# Patient Record
Sex: Male | Born: 1960 | Race: White | Hispanic: No | Smoking: Current every day smoker
Health system: Southern US, Academic
[De-identification: ages and names within clinical notes are randomized; demographics above are authoritative.]

## PROBLEM LIST (undated history)

## (undated) DIAGNOSIS — I10 Essential (primary) hypertension: Secondary | ICD-10-CM

## (undated) DIAGNOSIS — N289 Disorder of kidney and ureter, unspecified: Secondary | ICD-10-CM

## (undated) DIAGNOSIS — E119 Type 2 diabetes mellitus without complications: Secondary | ICD-10-CM

## (undated) DIAGNOSIS — K529 Noninfective gastroenteritis and colitis, unspecified: Secondary | ICD-10-CM

## (undated) DIAGNOSIS — E785 Hyperlipidemia, unspecified: Secondary | ICD-10-CM

## (undated) DIAGNOSIS — N400 Enlarged prostate without lower urinary tract symptoms: Secondary | ICD-10-CM

## (undated) DIAGNOSIS — Z0000 Encounter for general adult medical examination without abnormal findings: Secondary | ICD-10-CM

## (undated) DIAGNOSIS — Z720 Tobacco use: Secondary | ICD-10-CM

## (undated) DIAGNOSIS — M722 Plantar fascial fibromatosis: Secondary | ICD-10-CM

## (undated) HISTORY — DX: Hyperlipidemia, unspecified: E78.5

## (undated) HISTORY — DX: Type 2 diabetes mellitus without complications: E11.9

## (undated) HISTORY — DX: Type 2 diabetes mellitus without complications (CMS HCC): E11.9

## (undated) HISTORY — PX: OTHER SURGICAL HISTORY: SHX169

## (undated) HISTORY — DX: Tobacco use: Z72.0

## (undated) HISTORY — DX: Benign prostatic hyperplasia without lower urinary tract symptoms: N40.0

## (undated) HISTORY — DX: Encounter for general adult medical examination without abnormal findings: Z00.00

## (undated) HISTORY — DX: Plantar fascial fibromatosis: M72.2

## (undated) HISTORY — PX: COLONOSCOPY: SHX174

## (undated) HISTORY — PX: TOOTH EXTRACTION: SUR596

---

## 2010-02-13 ENCOUNTER — Encounter (INDEPENDENT_AMBULATORY_CARE_PROVIDER_SITE_OTHER): Payer: Self-pay | Admitting: Anesthesiology

## 2010-02-13 ENCOUNTER — Ambulatory Visit (INDEPENDENT_AMBULATORY_CARE_PROVIDER_SITE_OTHER): Payer: 59 | Admitting: Anesthesiology
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
## 2010-02-13 NOTE — Patient Instructions (Signed)
[facility]  [address]  [address]  [place], [place] [address]  [phone]    Outpatient Procedure Guidelines        1. You MUST bring a driver with you on the day of your injection or your procedure will be cancelled and you MUST arrive 30 minutes prior to your scheduled appointment time.    2. You must be free of any infection in order to have an injection of any type.  If you become ill, please call the clinic at [phone], for advice regarding the rescheduling of your injection.  Your injection will be cancelled if you are presently on any antibiotic treatment for infection.      3. You may take any scheduled medications, including pain medications, blood pressure medications and heart medications, unless otherwise advised.    4. Please notify the pain clinic if you are on any Blood Thinners such as: Plavix, Ticlid, Coumadin, Platal, Lovenox, Aggrenox or any other acticoagulant. These medications require special instructions.      5. DIABETIC PATIENTS: Please be sure to check your glucose level in the morning of your injection.  Also, you will need to check your glucose levels more frequently for the 1st two or three days following your injection. (Some patients may notice an increase in their glucose levels after their procedure).    6. No solid food at least 6 hours prior to your scheduled procedure time.  You may have small amounts of clear liquids up to 2 hours prior to the procedure.

## 2010-02-19 ENCOUNTER — Ambulatory Visit (INDEPENDENT_AMBULATORY_CARE_PROVIDER_SITE_OTHER): Payer: No Typology Code available for payment source | Admitting: Anesthesiology

## 2010-02-19 NOTE — Procedures (Signed)
[facility]  [facility]  [address], [address]  [place], [place] [address]  [phone]    PAIN CLINIC    PATIENT NAME: Raymond Day  CHART ZDGUYQ:[id number]  DATE OF BIRTH: 1960/11/20  DATE OF SERVICE:02/19/2010    SITE OF SERVICE  [facility].    PREOPERATIVE DIAGNOSIS:CERVICAL NERVE ROOT COMPRESSION [723.4AW]INJURY, NERVE, SYMPATHETIC, CERVICAL [954.0D]   HERNIATED CERVICAL DISC [638.0AS]POSTOPERATIVE DIAGNOSIS: Same      NAME OF PROCEDURE: Cervical epidural.  .     SURGEON:  Rodman Comp, MD (staff).      ANESTHESIA: Local        ESTIMATED BLOOD LOSS:None      COMPLICATIONS:  None      DESCRIPTION OF PROCEDURE:  Informed consent was obtained.  The patient was taken to the operating room and placed in a prone position.  The neck was prepped and draped.  The C7- T1 interspace was identified.  Appropriate cervical diameter measurements were performed.  Local infiltration with 1% Xylocaine was performed.  Loss of resistance with saline identified the epidural space.  Fluoroscopic guidance was used.  Position was verified with Omnipaque, which showed a cervical epidurogram with no washout.  A total of 4 mL of volume was injected, including 4cc Dexamethasone.  The patient tolerated the procedure well and will follow up in clinic.    Rodman Comp, MD 02/19/2010, 11:19 AM

## 2010-02-19 NOTE — Patient Instructions (Signed)
[facility]  Pain Management Services                                           SPECIAL PROCEDURES                                     DISCHARGE FORM                                          [phone]      Please follow the instructions listed below for your procedures.  If you have questions concerning your procedure, you may call and leave a message.  Messages will be returned by the end of the next business day.  If you have an emergency, proceed to your local [facility] Department.      PROCEDURE: ESI C7 T1    1.  Do not drive a car or operate machinery until tomorrow.  2.  Rest today and return to normal activities tomorrow.  3.  If you are on restricted actiivities by your physician, please continue to follow these.  If      you are not sure, contact your physician.  4.  It is possible to experience mild numbness of the lower back and legs.  This is                  temporary.  5   If you have soreness at the injection site, the application of heat or ice may be helpful.      Mild analgesics may also be used.  6.  In case of sever headache; lie flat to decrease it.  Increase all fluids especially those         with caffeine.  Mild analgesics are also appropriate.  If headache is not relieved by             these measures, contact the Pain Clinic.  7.  Steroid injections may cause temporary increase of blood sugar levels.    These instructions have been reviewed with the patient and appropriate questions have answers.  Seher Schlagel

## 2010-02-19 NOTE — Progress Notes (Signed)
Patient Active Problem List   Diagnoses Code   . Neck pain 723.1B   . Shoulder pain, bilateral 719.41AQ       1. Cervical nerve root compression (723.4AW)  PAIN CL FL EPIDURAL STEROID INJ CERVICAL, EPIDURAL CERV THORACIC (AMB ONLY)   2. 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 Herniated cervical disc (722.0AS)  PAIN CL FL EPIDURAL STEROID INJ CERVICAL, EPIDURAL CERV THORACIC (AMB ONLY)       Chief Complaint   Patient presents with   . Neck Pain     all over   . Shoulder Pain     left and right   . Head Pain     left side

## 2010-03-09 ENCOUNTER — Encounter (INDEPENDENT_AMBULATORY_CARE_PROVIDER_SITE_OTHER): Payer: Self-pay | Admitting: Anesthesiology

## 2010-03-09 ENCOUNTER — Ambulatory Visit (INDEPENDENT_AMBULATORY_CARE_PROVIDER_SITE_OTHER): Payer: No Typology Code available for payment source | Admitting: Anesthesiology

## 2010-03-09 MED ORDER — TRAMADOL 50 MG TABLET
50.0000 mg | ORAL_TABLET | Freq: Four times a day (QID) | ORAL | Status: DC | PRN
Start: 2010-03-09 — End: 2021-01-22

## 2010-03-09 NOTE — Progress Notes (Signed)
Date:03/09/2010    Chief Complaint:Chief Complaint   Patient presents with   . Neck Pain         Last Procedure:CESI no help      Medication:Current outpatient prescriptions   Medication Sig   . paroxetine (PAXIL) 10 mg Tab take 10 mg by mouth QAM.   . alprazolam (XANAX) 0.5 mg Tab take 1 mg by mouth Every night as needed.   . mesalamine (ASACOL) 400 mg TbEC take 400 mg by mouth Three times a day.   . Ibuprofen (MOTRIN) 800 mg Tab take 800 mg by mouth Twice daily.   Marland Kitchen gemfibrozil (LOPID) 600 mg Tab take 600 mg by mouth Twice a day before meals.   Marland Kitchen loperamide (IMODIUM) 2 mg Cap take 2 mg by mouth Twice daily.   . hydrocodone-acetaminophen (LORTAB) 7.5-500 mg Tab take 1 Tab by mouth Every 4 hours as needed.   . tramadol (ULTRAM) 50 mg Tab take 50 mg by mouth Every 6 hours as needed.                     PFMSHx: I have reviewed and updated as appropriate the past medical, family and social history.        ROS: Reviewed      Exam:    Constitutional: Vitals and Weight noted    Eyes: Conjunctiva and lids: normal    Head: NC/AT    Neck: Normal external inspection    Respiratory: normal effort    Cardiovascular: no JVD or peripheral cardiac edema    Abdomen: non tender    Neurological: DTR'S symmetrical                        Sensory intact                        Motor intact                          Psychiatric: Judgement normal                      Insight good                      Oriented times 3                      Memory intact                      Mood stable                      Affect normal    Musculoskelatal: Gait:cane assist                              SLR: equal                              Strength: equal                              Facets:neg  SI Joints:no                              Spasm:pos                              Pain to Palpation:no  Pain in both arms    Assessment:  Patient Active Problem List   Diagnoses Code   . Neck pain 723.1B    . Shoulder pain, bilateral 719.09WJ               Plan:surgical consult                                                                                                    Rodman Comp, MD 03/09/2010, 1:11 PM

## 2010-03-11 ENCOUNTER — Other Ambulatory Visit (INDEPENDENT_AMBULATORY_CARE_PROVIDER_SITE_OTHER): Payer: Self-pay | Admitting: Anesthesiology

## 2010-03-27 ENCOUNTER — Encounter (INDEPENDENT_AMBULATORY_CARE_PROVIDER_SITE_OTHER): Payer: Self-pay

## 2010-03-27 NOTE — Progress Notes (Unsigned)
Patients girlfriend called stating patient in extreme pain, requesting that Dr. Felizardo Hoffmann call in some Vicodin for him. She became very irate and loud when I attempted to explain the narcotic protocol, I recommended he present to ed or urgent care, but he refuses. I again attempted to explain the protocol, but she would not stop yelling about his pain and vicodin, I explained protocol and again recommended ed, I then ended the call.

## 2010-04-22 ENCOUNTER — Encounter (INDEPENDENT_AMBULATORY_CARE_PROVIDER_SITE_OTHER): Payer: Self-pay | Admitting: Anesthesiology

## 2010-04-22 NOTE — Progress Notes (Signed)
Attempted to return call, number was a non working number

## 2010-04-29 ENCOUNTER — Encounter (INDEPENDENT_AMBULATORY_CARE_PROVIDER_SITE_OTHER): Payer: Self-pay | Admitting: Anesthesiology

## 2010-04-29 NOTE — Progress Notes (Signed)
Once again patient was made aware of clinic opioid protocol, and advised to contact pcp, patient agreed to do so

## 2010-06-22 ENCOUNTER — Ambulatory Visit (INDEPENDENT_AMBULATORY_CARE_PROVIDER_SITE_OTHER): Payer: No Typology Code available for payment source | Admitting: Orthopaedic Surgery of the Spine

## 2010-06-22 ENCOUNTER — Encounter (INDEPENDENT_AMBULATORY_CARE_PROVIDER_SITE_OTHER): Payer: Self-pay | Admitting: Orthopaedic Surgery of the Spine
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
## 2010-07-20 ENCOUNTER — Ambulatory Visit (INDEPENDENT_AMBULATORY_CARE_PROVIDER_SITE_OTHER): Payer: No Typology Code available for payment source | Admitting: Orthopaedic Surgery of the Spine

## 2010-07-20 ENCOUNTER — Ambulatory Visit
Admission: RE | Admit: 2010-07-20 | Discharge: 2010-07-20 | Disposition: A | Discharge status: Home / Self Care | Payer: No Typology Code available for payment source | Source: Ambulatory Visit | Attending: Orthopaedic Surgery of the Spine | Admitting: Orthopaedic Surgery of the Spine

## 2010-07-20 NOTE — Progress Notes (Signed)
To be dictated  Arta Silence, MD 07/20/2010, 12:08 PM

## 2010-07-21 NOTE — Progress Notes (Signed)
[facility] Department of Orthopaedics  [address]  [place], [place] [address]      PROGRESS NOTE    PATIENT NAME: Raymond, Day  CHART NUMBER: [id number]  DATE OF BIRTH: 04/10/1961  DATE OF SERVICE: 07/20/2010    SUBJECTIVE: Raymond Day is here for followup of his neck symptoms. We ordered a cervical MRI and he had that this morning. I last saw him on 06/22/2010. He really has chronic neck pain with bilateral arm pain and bilateral leg pains. He is on chronic tramadol, but he has been on narcotics in the past.    OBJECTIVE: I reviewed his cervical MRI and there are minimal bulges at C3-C4 and C5-C6 with no compression at all. It really looks okay.    ASSESSMENT AND PLAN: I went over this with him and there is no indication for any surgical intervention or injections. I will see him back on an as needed basis.      Arta Silence, MD  Professor and Chair  [facility] [facility] of Orthopaedics    [id number]; D: 07/20/2010 12:13:05; T: 07/20/2010 21:39:04    cc: Duane Lope MD   [address]    [place], [place] [address]

## 2010-09-07 ENCOUNTER — Encounter (INDEPENDENT_AMBULATORY_CARE_PROVIDER_SITE_OTHER): Payer: Self-pay | Admitting: Anesthesiology

## 2010-09-07 NOTE — Progress Notes (Signed)
Received rx request for ultram, patient will need to schedule med f/u

## 2011-01-08 ENCOUNTER — Ambulatory Visit: Payer: Self-pay | Admitting: Unknown Physician Specialty

## 2011-01-12 LAB — PATHOLOGY REPORT

## 2012-04-13 ENCOUNTER — Encounter (HOSPITAL_COMMUNITY): Payer: Self-pay

## 2012-04-13 NOTE — Ancillary Notes (Signed)
[facility] Record for: Raymond Day, Raymond Day   Created: 03/10/2010 3:47:21 PM  MRN: [id number]  DOB: 1961/06/13  SSN: not in [employer]  Sex: Male  Height:  Weight:   Juanell Fairly Name:   Address:   [address]  [place]: [place], [place] [address]  E-Mail:   Day Phone: [phone]--not is service Night Phone:   Phone comments:   Call Back time:   Authorized contact:   Intake Date: 03/10/2010 3:47:21 PM  PCP:    RefMD: Felizardo Hoffmann MD, Richard   Primary Insurance: [employer]  Secondary Insurance:    Insurance Comments: PCP IS [facility] PER [facility] IN [place]     -------Referral Assignment------  Where was the original referral directed?: [facility] Center  Was a specific reviewer requested?: Specific MD Requested  How was the reviewer selected?:   Who requested the specific reviewer?: Other Physician  How did you hear of our spine program?:   Is this a second opinion?:      -------Merchandiser, retail----------  [employer]:   City of Birth:      ---------- 1st Review ----------     Review Date:   Review Completed by:   Impression:   Disposition:   Colleague:   Urgency:   Pre Treatment Type:   Appt Type:   Instructions:      ---------- Symptoms ----------     Chief complaint:   Diagnosis from Other MD:      Symptoms:   Other Symptom Description:   Pain Location:   Other Pain Location Description:   Where is your pain the worst?:   Pain Type:   Pain Rating:   Does the pain radiate to other parts of your body?    Radiate Where:   Other Description:   Does it radiate to the fingers?   Does it radiate below the elbow?   Which specific part of your arm?   Which fingers?   Which part of your arm does pain go to?   How does it radiate to the arm?   Does it radiate to the toes?   Does it radiate below the knee?   Which specific part of your leg?   Which toes?   Which part of your leg does pain go to?   How does it radiate to the leg?   Additional pain information      Location of Numbness/tingling:   Other Description:   Does numbness/tingling radiate to other parts of your body?:  Where does the numbess/tingling radiate?:  Other Description:  Does the numbness/tingling radiate below your elbow?:  Which specific part of your arm?:  Which fingers:  Which pat of your arm does the numbness/tingling go to?:  How does the numbness/tingling radiate to your arm?:  Does the numbness/tingling radiate below your knee?:  Which specific part of your leg?:  Which toes:  Which part of your leg does the numbness/tingling go to?:  How does the numbness/tingling radiate to your leg?:  Additional Numbness/tingling information:  Other Description:      Location of Weakness:   Other Description:   Additional Weakness Information:      The symptoms have been present for:   The symptoms began:   Was there a specific event that caused your symptoms?:   Additional Narrative Description:   Are you able to perform your daily activities with these symptoms?:   Since what date have you been unable to perform your daily routine?:   The symptoms improve  when you:   Other activities that improve your symptoms:   The symptoms worsen when you:   Other activities that worsen your symptoms:      ---------- Work History ----------     Are you able to work?:   Reasons for not working:   Other Reason for not working:   Do you have Work Restrictions:   If applicable, maximum lifting restriction:   How long have you been unable to work:   Have you ever filed a W/C claim related to a neck or back injury?:   Occupation:   Other Occupation Information:      ---------- Bowel/Bladder/Incontinence Issues ----------     Since the onset of symptoms, have you experienced any new problems urinating or having bowel movements?:   Description:   Other Description:   How long have you had these bowel/bladder problems?:      ---------- Allergies ----------     Do you have any medication allergies?   Allergies:   Other Details:   Allergic to Latex?:    Allergic to intravenous contrast dyes?:   Allergic to steroids?:      ---------- Treatment/Testing ----------      Taking prescription medication for this problem?:   Are you using any now?:   Have you received a Medrol dose pack for this problem?:   When did you last take the dosepack?:   Were your symptoms improved?:   Would you describe your relief as:   How long did you experience that amount of relief?:      --------------- PT ---------------     PT:   When Received:   Where Received:   Other where received information:   Visits:   Types:   Other Types:   Improved:      If improved, describe level of relief:   If improved, how long did you experience relief:   Other Physical Therapy Treatment Information:      ---------- Chiropractic Services ----------     Chiro:   When:   Who:   Visits:   Types:   Other Types:   Were Symptoms Improved:   If improved, describe level of relief:   If improved, how long did you experience relief:   Other Chiropractic Treatment Information:      --------------- ESI ---------------     ESI: Yes  When: 02/19/10 C7-T1  Who: Dr. Felizardo Hoffmann  Visits:   Types: ESI  Improved:   If improved, describe level of relief:   If improved, how long did you experience relief:   Other Injection Treatment Information:   Other MRI factors:      ---------- Diagnostic Tests ----------     MRI scan [id number] Where: [facility] PACS  [id number]  Area Scanned: Cervical  Do you have any of the following in case an MRI is ordered?:      ---------- Past Medical History ----------     What other doctors/providers have treated you for these spine issues? (Specialty: )  (Specialty: )  (Specialty: )  Ever diagnosed with Spine deformity?:   Prior neck or back surgery (1)?:   When:   Who:   Area of neck/spine operated on:   Level:   Were symptoms improved?:   If improved, describe level of relief:  If improved, how long did you experience relief?:   Prior neck or back surgery (2)?:   When:   Who:    Area of neck/spine operated on:   Level:  Were symptoms improved?:   If improved, describe level of relief:  If improved, how long did you experience relief?:   Prior neck or back surgery (3)?:   When:   Who:   Area of neck/spine operated on:   Level:   Were symptoms improved?:   If improved, describe level of relief:  If improved, how long did you experience relief?:   Do you have any of the following assistive devices?   How long have you required these assistive devices?   Currently being treated for any other medical condition?:   Conditions:   Other Conditions:   Type of neurologic disorder:   Cancer Type:   Other Treatment/Medication:   What blood thinners are you currently taking?:   Which physicians are treating you for your other medical conditions?:   Do you smoke?:   Are you pre-menopausal or post-menopausal?:   If recommended, are you willing to consider surgery?:   What is your goal in seeking treatment?:   Other pertinent information/ general comments/ goals for treatment:      ---------- Care Coordinator Information ----------     Care Coordinator: RS  Activity Log: 03/10/2010 3:58:17 PM [LC]: Pt referred to Dr. Shea Evans by Dr. Felizardo Hoffmann for surgical consideration.  New cervical MRI ordered but not yet scheduled.  Will bypass review and schedule an a new patient appointment with Dr. Shea Evans after the MRI is completed.  Carroll Kinds, RN; 05/28/2010 3:06:38 PM Lawson Fiscal Mueller]: RS to schedule with Dr. Shea Evans, since MRI has been ordered but never completed. LMueller RN CRRN----     Letter/Test Coordinator: Letters   Letter/Test Coordinator Log: 03/11/2010 2:08:38 PM [CT]: Jamal Maes calling pt and his number is no longer in service. (same  in Epic). Will mail a post card. Ctephabock; 03/17/2010 1:30:50 PM [CT]: Pt called in and I explained that we are waiting on his mri to be scheduled. Ctephabock; 03/19/2010 10:38:56 AM [TB]: MRI not scheduled yet. TBenson; 03/23/2010 10:24:06 AM [CT]: Mri still isn't scheduled. 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 Ctephabock; 03/25/2010 9:14:25 AM [CT]: Email to [facility] to check on the scheduling of pts mri. Ctephabock; 03/26/2010 9:14:42 AM [CT]: Email back from [place] that they are still waiting on insurance auth for the mri. Ctephabock; 04/01/2010 8:46:04 AM [TB]: MRI not scheduled yet. TBenson; 04/08/2010 10:05:44 AM [ML]: MRI not scheduled yet//MLivengood; 04/14/2010 9:58:39 AM [TB]: no MRI yet. TBenson; 04/16/2010 11:28:22 AM [ML]: called [facility] and check on status of mri ... they are having trouble getting auth for mri but are still working on it, she is aware once mri is scheduled we will set up apt for pt to see Dr. Mallie Mussel; 04/30/2010 10:44:35 AM [TB]: No MRI scheduled yet. TBenson9/29/2011 8:53:16 AM [Monica Livengood]: mri still not scheduled, will send to RN to direct care//MLivengood; 05/28/2010 3:17:09 PM [Crystal Tephabock]: Tried calling pt and number isn't in service. Post card mailed. Ctephabock; 05/28/2010 3:17:47 PM [Crystal Tephabock]: Post card mailed. Ctephabock; 06/04/2010 10:12:48 AM Elnita Maxwell Chidester]: Mailed patient never called letter to referring MD. C. Chidester; 06/09/2010 11:27:36 AM Rosey Bath Benson]: Patient returned call and is scheduled with Dr. Shea Evans on 06/22/10 at 11am. Called and spoke with [facility] and said insurance requires nonthing at this time and ok to scheduled. Armed forces technical officer. TBenson----     Intake Specialist Comments:      Clinic Staff Comments:      Last Edited by: Karma Lew on 04/13/2012 2:45:51 PM  Last Review by:   on

## 2014-09-11 DIAGNOSIS — N400 Enlarged prostate without lower urinary tract symptoms: Secondary | ICD-10-CM

## 2014-09-11 HISTORY — DX: Benign prostatic hyperplasia without lower urinary tract symptoms: N40.0

## 2015-09-15 DIAGNOSIS — Z0000 Encounter for general adult medical examination without abnormal findings: Secondary | ICD-10-CM

## 2015-09-15 DIAGNOSIS — Z720 Tobacco use: Secondary | ICD-10-CM

## 2015-09-15 HISTORY — DX: Encounter for general adult medical examination without abnormal findings: Z00.00

## 2015-09-15 HISTORY — DX: Tobacco use: Z72.0

## 2015-09-24 ENCOUNTER — Encounter: Payer: Self-pay | Admitting: Urology

## 2015-09-29 ENCOUNTER — Ambulatory Visit (INDEPENDENT_AMBULATORY_CARE_PROVIDER_SITE_OTHER): Payer: Commercial Managed Care - PPO | Admitting: Urology

## 2015-09-29 ENCOUNTER — Encounter: Payer: Self-pay | Admitting: Urology

## 2015-09-29 VITALS — BP 110/68 | HR 76 | Ht 68.0 in | Wt 131.9 lb

## 2015-09-29 DIAGNOSIS — R972 Elevated prostate specific antigen [PSA]: Secondary | ICD-10-CM | POA: Diagnosis not present

## 2015-09-29 LAB — URINALYSIS, COMPLETE
Bilirubin, UA: NEGATIVE
Glucose, UA: NEGATIVE
Ketones, UA: NEGATIVE
Leukocytes, UA: NEGATIVE
Nitrite, UA: NEGATIVE
PH UA: 5 (ref 5.0–7.5)
Protein, UA: NEGATIVE
Specific Gravity, UA: 1.025 (ref 1.005–1.030)
UUROB: 0.2 mg/dL (ref 0.2–1.0)

## 2015-09-29 LAB — MICROSCOPIC EXAMINATION

## 2015-09-29 NOTE — Progress Notes (Signed)
09/29/2015 1:26 PM   Shaun Bradley [birth year] [id number]  Referring provider: Kirk Ruths, MD [facility] [place], [place] [address]  Chief Complaint  Patient presents with  . New Patient (Initial Visit)    elevated PSA    HPI: The patient is a 55 year old white male who presents today for further evaluation and management of an elevated PSA. This was done as a routine evaluation. The patient states that over the past 6 months he has not had any progression of his voiding symptoms. States that he has minimal voiding symptoms, strong stream, does not get up at night to void, feels if he increases his bladder completely, and has no history of urinary tract infections. In addition, the patient denies any dysuria or gross hematuria.    The patient does not take any medications for his voiding symptoms. He has no family history of prostate cancer.   The patient gets excellent erections without the aid of medications.  He has minimal voiding symptoms.  PSA:  1.29 on 08/2014 11.06 on 1/15/ 17 5.49 on 07/19/16   PMH: Past Medical History  Diagnosis Date  . Benign prostatic hypertrophy without urinary obstruction 09/11/2014  . Current tobacco use 09/15/2015    Overview:  Have discussed dc multiple times   . Encounter for general adult medical examination without abnormal findings 09/15/2015    Last Assessment & Plan:  Colonoscopy with one adenoma 2012 and due 2017. Tobacco use noted.      Surgical History: Past Surgical History  Procedure Laterality Date  . Colonoscopy    . Tooth extraction      Home Medications:    Medication List       This list is accurate as of: 09/29/15  1:26 PM.  Always use your most recent med list.               aspirin 81 MG tablet  Take 81 mg by mouth daily.     multivitamin tablet  Take 1 tablet by mouth daily.     oxyCODONE-acetaminophen 5-325 MG tablet  Commonly known as:  PERCOCET/ROXICET  Take 1  tablet by mouth every 4 (four) hours as needed for severe pain (foot pain).        Allergies:  Allergies  Allergen Reactions  . Penicillin G Rash    Family History: Family History  Problem Relation Age of Onset  . Prostate cancer Neg Hx   . Bladder Cancer Neg Hx   . Kidney disease Neg Hx     Social History:  reports that he has been smoking Cigarettes.  He has been smoking about 1.00 pack per day. He does not have any smokeless tobacco history on file. He reports that he drinks about 21.6 oz of alcohol per week. He reports that he does not use illicit drugs.  ROS: UROLOGY Frequent Urination?: No Hard to postpone urination?: No Burning/pain with urination?: No Get up at night to urinate?: No Leakage of urine?: No Urine stream starts and stops?: No Trouble starting stream?: No Do you have to strain to urinate?: No Blood in urine?: No Urinary tract infection?: No Sexually transmitted disease?: No Injury to kidneys or bladder?: No Painful intercourse?: No Weak stream?: No Erection problems?: No Penile pain?: No  Gastrointestinal Nausea?: No Vomiting?: No Indigestion/heartburn?: No Diarrhea?: No Constipation?: No  Constitutional Fever: No Night sweats?: No Weight loss?: No Fatigue?: No  Skin Skin rash/lesions?: No Itching?: No  Eyes Blurred  vision?: No Double vision?: No  Ears/Nose/Throat Sore throat?: No Sinus problems?: Yes  Hematologic/Lymphatic Swollen glands?: No Easy bruising?: No  Cardiovascular Leg swelling?: No Chest pain?: No  Respiratory Cough?: No Shortness of breath?: No  Endocrine Excessive thirst?: No  Musculoskeletal Back pain?: No Joint pain?: No  Neurological Headaches?: No Dizziness?: No  Psychologic Depression?: No Anxiety?: No  Physical Exam: BP 110/68 mmHg  Pulse 76  Ht 5\' 8"  (1.727 m)  Wt 131 lb 14.4 oz (59.829 kg)  BMI 20.06 kg/m2  Constitutional:  Alert and oriented, No acute distress. HEENT: [place] AT,  moist mucus membranes.  Trachea midline, no masses. Cardiovascular: No clubbing, cyanosis, or edema. Respiratory: Normal respiratory effort, no increased work of breathing. GI: Abdomen is soft, nontender, nondistended, no abdominal masses GU: No CVA tenderness.   DRE: The patient has an asymmetric prostate, 30 g , prominent firm ridge on the right lateral aspect. Skin: No rashes, bruises or suspicious lesions. Lymph: No cervical or inguinal adenopathy. Neurologic: Grossly intact, no focal deficits, moving all 4 extremities. Psychiatric: Normal mood and affect.  Laboratory Data: No results found for: WBC, HGB, HCT, MCV, PLT  No results found for: CREATININE  No results found for: PSA  No results found for: TESTOSTERONE  No results found for: HGBA1C  Urinalysis No results found for: COLORURINE, APPEARANCEUR, LABSPEC, PHURINE, GLUCOSEU, HGBUR, BILIRUBINUR, KETONESUR, PROTEINUR, UROBILINOGEN, NITRITE, LEUKOCYTESUR  Pertinent Imaging:  none  Assessment & Plan:   The patient has an elevated PSA which when rechecked 5 days later reduced significantly. His rectal exam was abnormal however. Our plan at this point after a long discussion of PSA and the circumstances surrounding his elevated PSA are to repeat his PSA an rectal exam in 3 months. If at that point remains elevated, we would consider a prostate biopsy.  1. Elevated PSA  - Urinalysis, Complete - PSA, total and free   Return in about 6 weeks (around 11/10/2015).  Ardis Hughs, [facility] [address] [place], [place] [address] [phone]

## 2015-11-04 ENCOUNTER — Other Ambulatory Visit: Payer: Self-pay

## 2015-11-04 DIAGNOSIS — R972 Elevated prostate specific antigen [PSA]: Secondary | ICD-10-CM

## 2015-11-05 ENCOUNTER — Other Ambulatory Visit: Payer: Commercial Managed Care - PPO

## 2015-11-05 DIAGNOSIS — R972 Elevated prostate specific antigen [PSA]: Secondary | ICD-10-CM

## 2015-11-06 ENCOUNTER — Telehealth: Payer: Self-pay

## 2015-11-06 LAB — PSA: Prostate Specific Ag, Serum: 1.5 ng/mL (ref 0.0–4.0)

## 2015-11-06 NOTE — Telephone Encounter (Signed)
-----   Message from Ardis Hughs, MD sent at 11/06/2015  9:52 AM EST ----- Normal PSA

## 2015-11-06 NOTE — Telephone Encounter (Signed)
LMOM- PSA normal.

## 2015-11-10 ENCOUNTER — Ambulatory Visit (INDEPENDENT_AMBULATORY_CARE_PROVIDER_SITE_OTHER): Payer: Commercial Managed Care - PPO | Admitting: Urology

## 2015-11-10 ENCOUNTER — Encounter: Payer: Self-pay | Admitting: Urology

## 2015-11-10 VITALS — BP 122/66 | HR 79 | Ht 68.0 in | Wt 134.0 lb

## 2015-11-10 DIAGNOSIS — R972 Elevated prostate specific antigen [PSA]: Secondary | ICD-10-CM | POA: Diagnosis not present

## 2015-11-10 NOTE — Progress Notes (Signed)
3:00 PM   Shaun Bradley 1961-02-21 [id number]  Referring provider: Kirk Ruths, MD [facility] [place], [place] [address]  Chief Complaint  Patient presents with  . Elevated PSA    6WK    HPI: The patient is a 55 year old white male who presents today for further evaluation and management of an elevated PSA. This was done as a routine evaluation. The patient states that over the past 6 months he has not had any progression of his voiding symptoms. States that he has minimal voiding symptoms, strong stream, does not get up at night to void, feels if he increases his bladder completely, and has no history of urinary tract infections. In addition, the patient denies any dysuria or gross hematuria.    The patient does not take any medications for his voiding symptoms. He has no family history of prostate cancer.   The patient gets excellent erections without the aid of medications.  He has minimal voiding symptoms.  PSA:  1.29 on 08/2014 11.06 on 1/15/ 17 5.49 on 07/19/16 1.5 on 11/05/15  Interval: The patient follows up today 6 weeks after his last appointment with therapy PSA. His PSA has returned to normal. The patient has no progressive voiding symptoms. The patient denies any constipation or diarrhea. No change to his bowel habits. His appetite is stable. His no neurological changes.  PMH: Past Medical History  Diagnosis Date  . Benign prostatic hypertrophy without urinary obstruction 09/11/2014  . Current tobacco use 09/15/2015    Overview:  Have discussed dc multiple times   . Encounter for general adult medical examination without abnormal findings 09/15/2015    Last Assessment & Plan:  Colonoscopy with one adenoma 2012 and due 2017. Tobacco use noted.      Surgical History: Past Surgical History  Procedure Laterality Date  . Colonoscopy    . Tooth extraction      Home Medications:    Medication List       This list is accurate  as of: 11/10/15  3:00 PM.  Always use your most recent med list.               aspirin 81 MG tablet  Take 81 mg by mouth daily.     multivitamin tablet  Take 1 tablet by mouth daily.     oxyCODONE-acetaminophen 5-325 MG tablet  Commonly known as:  PERCOCET/ROXICET  Take 1 tablet by mouth every 4 (four) hours as needed for severe pain (foot pain).        Allergies:  Allergies  Allergen Reactions  . Penicillin G Rash    Family History: Family History  Problem Relation Age of Onset  . Prostate cancer Neg Hx   . Bladder Cancer Neg Hx   . Kidney disease Neg Hx     Social History:  reports that he has been smoking Cigarettes.  He has been smoking about 1.00 pack per day. He does not have any smokeless tobacco history on file. He reports that he drinks about 21.6 oz of alcohol per week. He reports that he does not use illicit drugs.  ROS: UROLOGY Frequent Urination?: No Hard to postpone urination?: No Burning/pain with urination?: No Get up at night to urinate?: No Leakage of urine?: No Urine stream starts and stops?: No Trouble starting stream?: No Do you have to strain to urinate?: No Blood in urine?: No Urinary tract infection?: No Sexually transmitted disease?: No Injury to kidneys  or bladder?: No Painful intercourse?: No Weak stream?: No Erection problems?: No Penile pain?: No  Gastrointestinal Nausea?: No Vomiting?: No Indigestion/heartburn?: No Diarrhea?: No Constipation?: No  Constitutional Fever: No Night sweats?: No Weight loss?: No Fatigue?: No  Skin Skin rash/lesions?: No Itching?: No  Eyes Blurred vision?: No Double vision?: No  Ears/Nose/Throat Sore throat?: No Sinus problems?: No  Hematologic/Lymphatic Swollen glands?: No Easy bruising?: Yes  Cardiovascular Leg swelling?: No Chest pain?: No  Respiratory Cough?: No Shortness of breath?: No  Endocrine Excessive thirst?: No  Musculoskeletal Back pain?: No Joint pain?:  No  Neurological Headaches?: No Dizziness?: No  Psychologic Depression?: No Anxiety?: No  Physical Exam: BP 122/66 mmHg  Pulse 79  Ht 5\' 8"  (1.727 m)  Wt 134 lb (60.782 kg)  BMI 20.38 kg/m2  Constitutional:  Alert and oriented, No acute distress. 08/2015:  DRE: The patient has an asymmetric prostate, 30 g , prominent firm ridge on the right lateral aspect.  DRE was not performed today  Laboratory Data: No results found for: WBC, HGB, HCT, MCV, PLT  No results found for: CREATININE  No results found for: PSA  No results found for: TESTOSTERONE  No results found for: HGBA1C  Urinalysis    Component Value Date/Time   GLUCOSEU Negative 09/29/2015 1342   BILIRUBINUR Negative 09/29/2015 1342   NITRITE Negative 09/29/2015 1342   LEUKOCYTESUR Negative 09/29/2015 1342    Pertinent Imaging:  none  Assessment & Plan:   the patient's PSA has returned to normal. He has no progressive voiding symptoms at this time. Our plan is to have the patient return in January 2018 for a PSA and a repeat rectal exam.   Return in about 10 months (around 09/11/2016).  Ardis Hughs, [facility] [address], [place], [place] [address] [phone]

## 2016-09-20 DIAGNOSIS — H40013 Open angle with borderline findings, low risk, bilateral: Secondary | ICD-10-CM | POA: Diagnosis not present

## 2016-12-10 DIAGNOSIS — Z125 Encounter for screening for malignant neoplasm of prostate: Secondary | ICD-10-CM | POA: Diagnosis not present

## 2016-12-10 DIAGNOSIS — Z0000 Encounter for general adult medical examination without abnormal findings: Secondary | ICD-10-CM | POA: Diagnosis not present

## 2016-12-10 DIAGNOSIS — N400 Enlarged prostate without lower urinary tract symptoms: Secondary | ICD-10-CM | POA: Diagnosis not present

## 2016-12-10 DIAGNOSIS — Z139 Encounter for screening, unspecified: Secondary | ICD-10-CM | POA: Diagnosis not present

## 2016-12-14 DIAGNOSIS — R202 Paresthesia of skin: Secondary | ICD-10-CM | POA: Diagnosis not present

## 2016-12-14 DIAGNOSIS — Z720 Tobacco use: Secondary | ICD-10-CM | POA: Diagnosis not present

## 2016-12-14 DIAGNOSIS — Z0000 Encounter for general adult medical examination without abnormal findings: Secondary | ICD-10-CM | POA: Diagnosis not present

## 2017-02-08 DIAGNOSIS — R079 Chest pain, unspecified: Secondary | ICD-10-CM | POA: Diagnosis not present

## 2017-02-09 DIAGNOSIS — M79671 Pain in right foot: Secondary | ICD-10-CM | POA: Diagnosis not present

## 2017-02-09 DIAGNOSIS — R079 Chest pain, unspecified: Secondary | ICD-10-CM | POA: Diagnosis not present

## 2017-02-09 DIAGNOSIS — M79672 Pain in left foot: Secondary | ICD-10-CM | POA: Diagnosis not present

## 2017-02-09 DIAGNOSIS — R0789 Other chest pain: Secondary | ICD-10-CM | POA: Diagnosis not present

## 2017-03-14 DIAGNOSIS — Z86010 Personal history of colonic polyps: Secondary | ICD-10-CM | POA: Diagnosis not present

## 2017-03-31 ENCOUNTER — Encounter: Payer: Self-pay | Admitting: Neurology

## 2017-03-31 ENCOUNTER — Ambulatory Visit (INDEPENDENT_AMBULATORY_CARE_PROVIDER_SITE_OTHER): Payer: Commercial Managed Care - PPO | Admitting: Neurology

## 2017-03-31 VITALS — BP 109/61 | HR 58 | Ht 68.0 in | Wt 138.0 lb

## 2017-03-31 DIAGNOSIS — M79672 Pain in left foot: Secondary | ICD-10-CM

## 2017-03-31 DIAGNOSIS — M79671 Pain in right foot: Secondary | ICD-10-CM | POA: Diagnosis not present

## 2017-03-31 MED ORDER — GABAPENTIN 300 MG PO CAPS: 300.0000 mg | ORAL_CAPSULE | Freq: Two times a day (BID) | ORAL | 3 refills | Status: DC

## 2017-03-31 NOTE — Patient Instructions (Signed)
   We will check EMG and NCV evaluation of the legs to look at the nerve function.  We will start gabapentin for the pain.  Neurontin (gabapentin) may result in drowsiness, ankle swelling, gait instability, or possibly dizziness. Please contact our office if significant side effects occur with this medication.

## 2017-03-31 NOTE — Progress Notes (Signed)
Reason for visit: Foot pain  Referring physician: Dr. Jacelyn Bradley is a 56 y.o. male  History of present illness:  Shaun Bradley is a 56 year old right-handed white male with a history of bilateral foot pain dating back about 2 years. He claims that about 2 and a half years ago he had problems with plantar fasciitis and had seen a podiatrist who wrapped the feet and he improved with his symptoms in about 6 months. The patient walks quite a bit with his work, he may walk at least 5 miles a day. The patient indicates that at nighttime he has burning sensations in the feet, he uses a heating pad on his feet. In the morning, when he first as out of bed bearing weight results in severe pain in both feet. The patient denies any back pain or pain down the legs. He denies imbalance problems or difficulty controlling the bowels or the bladder. He does not have any problems with neck pain or arm discomfort or numbness in the hands. He has a lot of pain with walking barefoot, it hurts him to walk on heels and the toes. He is sent to this office for an evaluation.   Past Medical History:  Diagnosis Date  . Benign prostatic hypertrophy without urinary obstruction 09/11/2014  . Current tobacco use 09/15/2015   Overview:  Have discussed dc multiple times   . Encounter for general adult medical examination without abnormal findings 09/15/2015   Last Assessment & Plan:  Colonoscopy with one adenoma 2012 and due 2017. Tobacco use noted.      Past Surgical History:  Procedure Laterality Date  . COLONOSCOPY    . dental procedure     1984  . TOOTH EXTRACTION     2015    Family History  Problem Relation Age of Onset  . Prostate cancer Neg Hx   . Bladder Cancer Neg Hx   . Kidney disease Neg Hx     Social history:  reports that he has been smoking Cigarettes.  He has been smoking about 1.00 pack per day. He has never used smokeless tobacco. He reports that he drinks about 21.6 oz of alcohol  per week . He reports that he does not use drugs.  Medications:  Prior to Admission medications   Medication Sig Start Date End Date Taking? Authorizing Provider  aspirin EC 81 MG tablet Take 81 mg by mouth daily.    Yes [provider]  BIOTIN PO Take 1 Dose by mouth daily.    Yes [provider]  GLUCOSAMINE-CHONDROITIN PO Take 1 Dose by mouth daily.   Yes [provider]  Multiple Vitamins-Minerals (MULTIVITAMIN PO) Take 1 tablet by mouth daily. Centrum Silber   Yes [provider]  Specialty Vitamins Products (PROSTATE PO) Take 1 capsule by mouth daily.   Yes [provider]  vitamin B-12 (CYANOCOBALAMIN) 1000 MCG tablet Take by mouth.   Yes [provider]  vitamin C (ASCORBIC ACID) 500 MG tablet Take 500 mg by mouth daily.    Yes [provider]  VITAMIN E PO Take 1 capsule by mouth daily.   Yes [provider]  CHANTIX CONTINUING MONTH PAK 1 MG tablet See admin instructions. see package 02/11/17   [provider]      Allergies  Allergen Reactions  . Penicillin G Rash    ROS:  Out of a complete 14 system review of symptoms, the patient complains only of the following  symptoms, and all other reviewed systems are negative.  Easy bruising Joint pain Runny nose, skin sensitivity Numbness Restless legs  Blood pressure 109/61, pulse (!) 58, height 5\' 8"  (1.727 m), weight 138 lb (62.6 kg).  Physical Exam  General: The patient is alert and cooperative at the time of the examination.  Eyes: Pupils are equal, round, and reactive to light. Discs are flat bilaterally.  Neck: The neck is supple, no carotid bruits are noted.  Respiratory: The respiratory examination is clear.  Cardiovascular: The cardiovascular examination reveals a regular rate and rhythm, no obvious murmurs or rubs are noted.  Neuromuscular: The patient is full range of movement of the low back.  Skin: Extremities are without  significant edema.  Neurologic Exam  Mental status: The patient is alert and oriented x 3 at the time of the examination. The patient has apparent normal recent and remote memory, with an apparently normal attention span and concentration ability.  Cranial nerves: Facial symmetry is present. There is good sensation of the face to pinprick and soft touch bilaterally. The strength of the facial muscles and the muscles to head turning and shoulder shrug are normal bilaterally. Speech is well enunciated, no aphasia or dysarthria is noted. Extraocular movements are full. Visual fields are full. The tongue is midline, and the patient has symmetric elevation of the soft palate. No obvious hearing deficits are noted.  Motor: The motor testing reveals 5 over 5 strength of all 4 extremities. Good symmetric motor tone is noted throughout.  Sensory: Sensory testing is intact to pinprick, soft touch, vibration sensation, and position sense on all 4 extremities, with exception of some decreased pinprick sensation up to the ankles bilaterally. No evidence of extinction is noted.  Coordination: Cerebellar testing reveals good finger-nose-finger and heel-to-shin bilaterally.  Gait and station: Gait is normal. Tandem gait is normal. Romberg is negative. No drift is seen.  Reflexes: Deep tendon reflexes are symmetric, but are depressed bilaterally. Toes are downgoing bilaterally.   Assessment/Plan:  1. Bilateral foot pain  The patient has many features consistent with plantar fasciitis, but he does have some mild sensory alteration and decreased reflexes in the legs and feet. The patient will be set up for nerve conduction studies of both legs, and EMG on one leg. If a peripheral neuropathy is noted, he will be set up for further blood work. He will be placed on gabapentin taking 300 mg twice daily for the discomfort. Otherwise, he will follow-up in 3-4 months.   Shaun Alexanders MD 03/31/2017 4:02  PM  [facility] [address] [place], [place] [address]  Phone [phone] Fax [phone]

## 2017-04-18 ENCOUNTER — Encounter: Payer: Self-pay | Admitting: Neurology

## 2017-04-18 ENCOUNTER — Ambulatory Visit (INDEPENDENT_AMBULATORY_CARE_PROVIDER_SITE_OTHER): Payer: Commercial Managed Care - PPO | Admitting: Neurology

## 2017-04-18 ENCOUNTER — Ambulatory Visit (INDEPENDENT_AMBULATORY_CARE_PROVIDER_SITE_OTHER): Payer: Self-pay | Admitting: Neurology

## 2017-04-18 DIAGNOSIS — M79671 Pain in right foot: Secondary | ICD-10-CM | POA: Diagnosis not present

## 2017-04-18 DIAGNOSIS — M79672 Pain in left foot: Secondary | ICD-10-CM

## 2017-04-18 NOTE — Progress Notes (Addendum)
The patient comes in today for EMG and nerve conduction study evaluation. The study shows evidence of some distal motor dysfunction of the peroneal nerves, but there is no evidence of a peripheral neuropathy or an overlying lumbosacral radiculopathy.  The history given by the patient is most consistent with plantar fasciitis, the patient has seen a podiatrist in the past but he does not remember the name of the doctor. He likely should have another referral to this physician.    Vonore    Nerve / Sites Muscle Latency Ref. Amplitude Ref. Rel Amp Segments Distance Velocity Ref. Area    ms ms mV mV %  cm m/s m/s mVms  L Peroneal - EDB     Ankle EDB NR ?6.5 NR ?2.0 NR Ankle - EDB 9   NR     Fib head EDB NR  NR  NR Fib head - Ankle   ?44 NR     Pop fossa EDB NR  NR  NR Pop fossa - Fib head   ?44 NR         Acc Peron - Pop fossa      R Peroneal - EDB     Ankle EDB 5.0 ?6.5 0.6 ?2.0 100 Ankle - EDB 9   2.2     Fib head EDB 14.6  0.5  86.1 Fib head - Ankle 39 41 ?44 1.5     Pop fossa EDB 18.4  0.2  42.7 Pop fossa - Fib head 10 26 ?44 0.8         Pop fossa - Ankle      L Tibial - AH     Ankle AH 4.6 ?5.8 6.7 ?4.0 100 Ankle - AH 9   13.8     Pop fossa AH 15.5  3.1  46.1 Pop fossa - Ankle 44 40 ?41 7.2  R Tibial - AH     Ankle AH 4.4 ?5.8 6.3 ?4.0 100 Ankle - AH 9   9.9     Pop fossa AH 14.3  4.4  68.8 Pop fossa - Ankle 44 44 ?41 6.7             SNC    Nerve / Sites Rec. Site Peak Lat Ref.  Amp Ref. Segments Distance Peak Diff Ref.    ms ms V V  cm ms ms  R Superficial peroneal - Ankle     Lat leg Ankle 3.1 ?4.4 4 ?6 Lat leg - Ankle 14    L Superficial peroneal - Ankle     Lat leg Ankle 3.0 ?4.4 13 ?6 Lat leg - Ankle 14    L Medial plantar, Lateral plantar - Ankle (Medial, lateral sole)     Medial plantar Sole Ankle 3.3 ?3.7 18 ?3 Medial plantar Sole - Ankle 14       Lateral plantar Sole Ankle 3.3 ?3.7 20 ?3 Lateral plantar Sole - Ankle 14          Medial plantar Sole - Lateral plantar Sole   0.0 ?0.0  R Medial plantar, Lateral plantar - Ankle (Medial, lateral sole)     Medial plantar Sole Ankle 3.0 ?3.7 25 ?3 Medial plantar Sole - Ankle 14       Lateral plantar Sole Ankle 3.2 ?3.7 14 ?3 Lateral plantar Sole - Ankle 14          Medial plantar Sole - Lateral plantar Sole  -0.2 ?0.0  H Reflex    Nerve H Lat Lat Hmax   ms ms   Left Right Ref. Left Right Ref.  Tibial - Soleus 37.1 38.4 ?35.0 40.7 41.8 ?35.0

## 2017-04-18 NOTE — Progress Notes (Signed)
Please refer to EMG and nerve conduction study procedure note. 

## 2017-04-18 NOTE — Procedures (Signed)
     HISTORY:  Shaun Bradley is a 56 year old gentleman with a history of bilateral foot pain that is worse with bearing weight first thing in the morning. The patient does report some numbness of the feet, he is being evaluated for a possible neuropathy or a lumbosacral radiculopathy.  NERVE CONDUCTION STUDIES:  Nerve conduction studies were performed on both lower extremities. The distal motor latencies for the peroneal nerves were unobtainable on the left and normal on the right with a low motor amplitude on the right. The nerve conduction velocities for the right peroneal nerve were slowed below and above the fibular head. The distal motor latencies and motor amplitudes for the posterior tibial nerves were within normal limits bilaterally with slowing seen for these nerves. The sensory latencies for the peroneal nerves were normal bilaterally and the medial and lateral plantar sensory latencies were within normal limits bilaterally. The H reflex latencies were prolonged bilaterally.  EMG STUDIES:  EMG study was performed on the left lower extremity:  The tibialis anterior muscle reveals 2 to 4K motor units with full recruitment. No fibrillations or positive waves were seen. The peroneus tertius muscle reveals 2 to 4K motor units with full recruitment. No fibrillations or positive waves were seen. The medial gastrocnemius muscle reveals 1 to 3K motor units with full recruitment. No fibrillations or positive waves were seen. The vastus lateralis muscle reveals 2 to 4K motor units with full recruitment. No fibrillations or positive waves were seen. The iliopsoas muscle reveals 2 to 4K motor units with full recruitment. No fibrillations or positive waves were seen. The biceps femoris muscle (long head) reveals 2 to 4K motor units with full recruitment. No fibrillations or positive waves were seen. The lumbosacral paraspinal muscles were tested at 3 levels, and revealed no abnormalities of  insertional activity at all 3 levels tested. There was good relaxation.   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 There is no evidence of an overlying lumbosacral radiculopathy. There is sensory sparing of both lower extremities, the medial and lateral plantar sensory latencies are normal. The study suggests distal motor and dysfunction primarily affecting the peroneal nerves.  Jill Alexanders MD 04/18/2017 1:47 PM  [facility] [address] [place], [place] [address]  Phone [phone] Fax [phone]

## 2017-04-26 DIAGNOSIS — Z86010 Personal history of colonic polyps: Secondary | ICD-10-CM | POA: Diagnosis not present

## 2017-04-26 DIAGNOSIS — D126 Benign neoplasm of colon, unspecified: Secondary | ICD-10-CM | POA: Diagnosis not present

## 2017-04-26 DIAGNOSIS — K635 Polyp of colon: Secondary | ICD-10-CM | POA: Diagnosis not present

## 2017-04-26 DIAGNOSIS — K5730 Diverticulosis of large intestine without perforation or abscess without bleeding: Secondary | ICD-10-CM | POA: Diagnosis not present

## 2017-04-26 DIAGNOSIS — D122 Benign neoplasm of ascending colon: Secondary | ICD-10-CM | POA: Diagnosis not present

## 2017-04-26 DIAGNOSIS — K620 Anal polyp: Secondary | ICD-10-CM | POA: Diagnosis not present

## 2017-05-04 DIAGNOSIS — H40013 Open angle with borderline findings, low risk, bilateral: Secondary | ICD-10-CM | POA: Diagnosis not present

## 2017-05-04 DIAGNOSIS — H524 Presbyopia: Secondary | ICD-10-CM | POA: Diagnosis not present

## 2017-06-01 ENCOUNTER — Telehealth: Payer: Self-pay | Admitting: Neurology

## 2017-06-01 DIAGNOSIS — Z23 Encounter for immunization: Secondary | ICD-10-CM | POA: Diagnosis not present

## 2017-06-01 MED ORDER — GABAPENTIN 300 MG PO CAPS: 600.0000 mg | ORAL_CAPSULE | Freq: Two times a day (BID) | ORAL | 3 refills | Status: DC

## 2017-06-01 NOTE — Telephone Encounter (Signed)
I called patient. He is on gabapentin taking 300 mg twice daily, to having burning pain in the feet, we will go up to 600 milligrams twice daily. He will call for any dose adjustments.

## 2017-06-01 NOTE — Telephone Encounter (Signed)
Pt called said gabapentin (NEURONTIN) 300 MG capsule is not helping with the pain and "fire" sensation. He is wanting to increase the dose. Please send to [facility]/[address] [place]. Please call to discuss

## 2017-07-15 ENCOUNTER — Ambulatory Visit: Payer: Commercial Managed Care - PPO | Admitting: Neurology

## 2017-08-12 ENCOUNTER — Ambulatory Visit: Payer: Commercial Managed Care - PPO | Admitting: Neurology

## 2017-08-12 ENCOUNTER — Encounter: Payer: Self-pay | Admitting: Neurology

## 2017-08-12 VITALS — BP 121/68 | HR 69 | Ht 68.0 in | Wt 140.0 lb

## 2017-08-12 DIAGNOSIS — M79672 Pain in left foot: Secondary | ICD-10-CM

## 2017-08-12 DIAGNOSIS — M722 Plantar fascial fibromatosis: Secondary | ICD-10-CM | POA: Diagnosis not present

## 2017-08-12 DIAGNOSIS — M79671 Pain in right foot: Secondary | ICD-10-CM

## 2017-08-12 HISTORY — DX: Plantar fascial fibromatosis: M72.2

## 2017-08-12 NOTE — Progress Notes (Signed)
Reason for visit: Foot pain  Shaun Bradley is an 57 y.o. male  History of present illness:  Shaun Bradley is a 56 year old right-handed white male with a history of plantar fasciitis of the feet.  The patient has had recurrence of some pain in this regard.  Evaluation did not show a definite peripheral neuropathy.  The patient has significant discomfort when he first gets up out of bed in the morning.  Fortunately, he has changed jobs and his job now is primarily a Network engineer job and he does not walk as much during the day.  This overall has helped his pain.  The patient is on low-dose gabapentin taking 300 mg twice daily.  The patient feels that this does help some, the gabapentin makes him somewhat spacey throughout the day.  The patient returns to this office for further evaluation.   Past Medical History:  Diagnosis Date  . Benign prostatic hypertrophy without urinary obstruction 09/11/2014  . Current tobacco use 09/15/2015   Overview:  Have discussed dc multiple times   . Encounter for general adult medical examination without abnormal findings 09/15/2015   Last Assessment & Plan:  Colonoscopy with one adenoma 2012 and due 2017. Tobacco use noted.      Past Surgical History:  Procedure Laterality Date  . COLONOSCOPY    . dental procedure     1984  . TOOTH EXTRACTION     2015    Family History  Problem Relation Age of Onset  . Prostate cancer Neg Hx   . Bladder Cancer Neg Hx   . Kidney disease Neg Hx     Social history:  reports that he has been smoking cigarettes.  He has been smoking about 1.00 pack per day. he has never used smokeless tobacco. He reports that he drinks about 21.6 oz of alcohol per week. He reports that he does not use drugs.    Allergies  Allergen Reactions  . Penicillin G Rash    Medications:  Prior to Admission medications   Medication Sig Start Date End Date Taking? Authorizing Provider  aspirin EC 81 MG tablet Take 81 mg by mouth daily.    Yes  [provider]  BIOTIN PO Take 1 Dose by mouth daily.    Yes [provider]  CHANTIX CONTINUING MONTH PAK 1 MG tablet See admin instructions. see package 02/11/17  Yes [provider]  gabapentin (NEURONTIN) 300 MG capsule Take 2 capsules (600 mg total) by mouth 2 (two) times daily. 06/01/17  Yes Kathrynn Ducking, MD  GLUCOSAMINE-CHONDROITIN PO Take 1 Dose by mouth daily.   Yes [provider]  Multiple Vitamins-Minerals (MULTIVITAMIN PO) Take 1 tablet by mouth daily. Centrum Silber   Yes [provider]  Specialty Vitamins Products (PROSTATE PO) Take 1 capsule by mouth daily.   Yes [provider]  vitamin B-12 (CYANOCOBALAMIN) 1000 MCG tablet Take by mouth.   Yes [provider]  vitamin C (ASCORBIC ACID) 500 MG tablet Take 500 mg by mouth daily.    Yes [provider]  VITAMIN E PO Take 1 capsule by mouth daily.   Yes [provider]    ROS:  Out of a complete 14 system review of symptoms, the patient complains only of the following symptoms, and all other reviewed systems are negative.  Bruising easily  Blood pressure 121/68, pulse 69, height 5\' 8"  (1.727 m), weight 140 lb (63.5 kg).  Physical Exam  General: The patient is  alert and cooperative at the time of the examination.  Skin: No significant peripheral edema is noted.   Neurologic Exam  Mental status: The patient is alert and oriented x 3 at the time of the examination. The patient has apparent normal recent and remote memory, with an apparently normal attention span and concentration ability.   Cranial nerves: Facial symmetry is present. Speech is normal, no aphasia or dysarthria is noted. Extraocular movements are full. Visual fields are full.  Motor: The patient has good strength in all 4 extremities.  Sensory examination: Soft touch sensation is symmetric on the face, arms, and legs.  Coordination: The patient has good  finger-nose-finger and heel-to-shin bilaterally.  Gait and station: The patient has a normal gait. Tandem gait is normal. Romberg is negative. No drift is seen.  Reflexes: Deep tendon reflexes are symmetric.   Assessment/Plan:  1.  Bilateral foot pain, plantar fasciitis  I do not think that the current pain syndrome is related to a neuropathy.  The patient may wish to go back to his podiatrist.  He will continue the gabapentin if he believes that this is helpful.  He will follow-up in 1 year, or as needed.  Shaun Alexanders MD 08/12/2017 7:54 AM  [facility] [address] [place], [place] [address]  Phone [phone] Fax [phone]

## 2017-10-11 ENCOUNTER — Telehealth: Payer: Self-pay | Admitting: *Deleted

## 2017-10-11 ENCOUNTER — Telehealth: Payer: Self-pay | Admitting: Neurology

## 2017-10-11 MED ORDER — GABAPENTIN 600 MG PO TABS: 600.0000 mg | ORAL_TABLET | Freq: Two times a day (BID) | ORAL | 3 refills | Status: DC

## 2017-10-11 NOTE — Telephone Encounter (Signed)
A PA was submitted on gabapentin 300mg  capsule. Waiting on determination from insurance

## 2017-10-11 NOTE — Telephone Encounter (Signed)
Initiated PA gabapentin 300mg  capsule on covermymeds. Key: [id number].

## 2017-10-11 NOTE — Telephone Encounter (Signed)
Patient requesting a new Rx for gabapentin (NEURONTIN) 600 MG Capsule called to [facility] on [address] in [place]. He says insurance will not pay for 300 MG.

## 2017-10-11 NOTE — Telephone Encounter (Signed)
I will call in the 600 mg capsule of gabapentin.

## 2017-10-11 NOTE — Telephone Encounter (Signed)
Dr. Jannifer Franklin- I am still waiting to hear back about PA. Should hear tomorrow or the next day if approved or not.  Would you like to keep him on the 300mg  capsule or call in 600mg  capsule instead?

## 2017-10-11 NOTE — Addendum Note (Signed)
Addended by: Kathrynn Ducking on: 10/11/2017 04:35 PM   Modules accepted: Orders

## 2017-10-12 NOTE — Telephone Encounter (Signed)
Called and LVM for pt to let him know we completed PA for gabapentin 300mg  capsule that was approved. He should be able to pick this up from the pharmacy. If there is still an issue, Dr. Jannifer Franklin did call in a 600mg  capsule. Asked him to call back if he has any further questions/concerns.

## 2017-10-12 NOTE — Telephone Encounter (Signed)
[id number]. PA gabapentin 300mg  capsule Approved01/13/2019-10/11/2018.

## 2017-10-12 NOTE — Telephone Encounter (Signed)
[id number]. PA gabapentin 300mg  capsule Approved01/13/2019-10/11/2018.

## 2017-12-08 DIAGNOSIS — Z125 Encounter for screening for malignant neoplasm of prostate: Secondary | ICD-10-CM | POA: Diagnosis not present

## 2017-12-08 DIAGNOSIS — Z0000 Encounter for general adult medical examination without abnormal findings: Secondary | ICD-10-CM | POA: Diagnosis not present

## 2017-12-15 DIAGNOSIS — Z720 Tobacco use: Secondary | ICD-10-CM | POA: Diagnosis not present

## 2017-12-15 DIAGNOSIS — Z0000 Encounter for general adult medical examination without abnormal findings: Secondary | ICD-10-CM | POA: Diagnosis not present

## 2017-12-15 DIAGNOSIS — R252 Cramp and spasm: Secondary | ICD-10-CM | POA: Diagnosis not present

## 2018-05-08 DIAGNOSIS — H40013 Open angle with borderline findings, low risk, bilateral: Secondary | ICD-10-CM | POA: Diagnosis not present

## 2018-08-14 ENCOUNTER — Ambulatory Visit: Payer: Commercial Managed Care - PPO | Admitting: Neurology

## 2018-08-14 ENCOUNTER — Encounter: Payer: Self-pay | Admitting: Neurology

## 2018-08-14 VITALS — BP 123/68 | HR 64 | Ht 68.0 in | Wt 131.0 lb

## 2018-08-14 DIAGNOSIS — M79672 Pain in left foot: Secondary | ICD-10-CM | POA: Diagnosis not present

## 2018-08-14 DIAGNOSIS — M79671 Pain in right foot: Secondary | ICD-10-CM

## 2018-08-14 MED ORDER — GABAPENTIN 600 MG PO TABS: ORAL_TABLET | ORAL | 1 refills | Status: DC

## 2018-08-14 NOTE — Progress Notes (Signed)
Reason for visit: Bilateral foot pain  Shaun Bradley is an 57 y.o. male  History of present illness:  Shaun Bradley is a 57 year old right-handed white male with a history of bilateral foot pain that is quite chronic.  The patient has been seen through podiatry but he has not been followed for over 2 years.  The patient cannot remember the doctor who saw him previously.  The patient reports bilateral foot pain that appears to be most consistent with a mechanical source of pain such as plantar fasciitis.  He feels much better when he is off of his feet, when he first bears weight in the morning, the pain is so severe that he may collapse at times, unfortunately his current job requires quite a bit of walking, he claims that he walks 5 miles a day.  The patient feels worse as the day goes on, when he is able to get off of his feet, he feels better.  The patient does report some numbness in the distal portions of the feet at times.  The patient denies any back pain or pain down the legs.  He currently is on gabapentin taking 600 mg twice daily which offer some benefit.  He returns to this office for an evaluation.   Past Medical History:  Diagnosis Date  . Benign prostatic hypertrophy without urinary obstruction 09/11/2014  . Current tobacco use 09/15/2015   Overview:  Have discussed dc multiple times   . Encounter for general adult medical examination without abnormal findings 09/15/2015   Last Assessment & Plan:  Colonoscopy with one adenoma 2012 and due 2017. Tobacco use noted.    . Plantar fasciitis 08/12/2017    Past Surgical History:  Procedure Laterality Date  . COLONOSCOPY    . dental procedure     1984  . TOOTH EXTRACTION     2015    Family History  Problem Relation Age of Onset  . Prostate cancer Neg Hx   . Bladder Cancer Neg Hx   . Kidney disease Neg Hx     Social history:  reports that he has been smoking cigarettes. He has been smoking about 1.00 pack per day. He has  never used smokeless tobacco. He reports current alcohol use of about 36.0 standard drinks of alcohol per week. He reports that he does not use drugs.    Allergies  Allergen Reactions  . Penicillin G Rash    Medications:  Prior to Admission medications   Medication Sig Start Date End Date Taking? Authorizing Provider  aspirin EC 81 MG tablet Take 81 mg by mouth daily.    Yes [provider]  BIOTIN PO Take 1 Dose by mouth daily.    Yes [provider]  CHANTIX CONTINUING MONTH PAK 1 MG tablet See admin instructions. see package 02/11/17  Yes [provider]  gabapentin (NEURONTIN) 600 MG tablet Take 1 tablet (600 mg total) by mouth 2 (two) times daily. 10/11/17  Yes Kathrynn Ducking, MD  GLUCOSAMINE-CHONDROITIN PO Take 1 Dose by mouth daily.   Yes [provider]  Multiple Vitamins-Minerals (MULTIVITAMIN PO) Take 1 tablet by mouth daily. Centrum Silber   Yes [provider]  Specialty Vitamins Products (PROSTATE PO) Take 1 capsule by mouth daily.   Yes [provider]  vitamin B-12 (CYANOCOBALAMIN) 1000 MCG tablet Take by mouth.   Yes [provider]  vitamin C (ASCORBIC ACID) 500 MG tablet Take 500 mg by mouth daily.  Yes [provider]  VITAMIN E PO Take 1 capsule by mouth daily.   Yes [provider]    ROS:  Out of a complete 14 system review of symptoms, the patient complains only of the following symptoms, and all other reviewed systems are negative.  Bruising easily Foot pain  Blood pressure 123/68, pulse 64, height 5\' 8"  (1.727 m), weight 131 lb (59.4 kg).  Physical Exam  General: The patient is alert and cooperative at the time of the examination.  Skin: No significant peripheral edema is noted.   Neurologic Exam  Mental status: The patient is alert and oriented x 3 at the time of the examination. The patient has apparent normal recent and remote memory, with an apparently normal  attention span and concentration ability.   Cranial nerves: Facial symmetry is present. Speech is normal, no aphasia or dysarthria is noted. Extraocular movements are full. Visual fields are full.  Motor: The patient has good strength in all 4 extremities.  Sensory examination: Soft touch sensation is symmetric on the face, arms, and legs.  Coordination: The patient has good finger-nose-finger and heel-to-shin bilaterally.  Gait and station: The patient has a normal gait. Tandem gait is normal. Romberg is negative. No drift is seen.  The patient is able to walk on heels and the toes bilaterally but he reports pain when walking on the toes.  Reflexes: Deep tendon reflexes are symmetric, but are depressed throughout..   Assessment/Plan:  1.  Bilateral foot pain, mechanical source pain  The patient has a history most consistent with a mechanical source pain in the feet, likely plantar fasciitis.  The patient will be sent for a podiatry evaluation, he will increase the gabapentin taking 600 mg twice during the day and 300 mg at midday.  He will follow-up here in 1 year or sooner if needed.  He will call for any dose adjustments.  Jill Alexanders MD 08/14/2018 8:02 AM  [facility] [address] [place], [place] [address]  Phone [phone] Fax [phone]

## 2018-08-14 NOTE — Patient Instructions (Signed)
We will go up on the gabapentin to 600 mg in the morning and evening and 1/2 at midday. Call for any dose adjustments.   Neurontin (gabapentin) may result in drowsiness, ankle swelling, gait instability, or possibly dizziness. Please contact our office if significant side effects occur with this medication.

## 2018-10-02 ENCOUNTER — Other Ambulatory Visit: Payer: Self-pay

## 2018-10-02 MED ORDER — GABAPENTIN 600 MG PO TABS: ORAL_TABLET | ORAL | 1 refills | Status: DC

## 2018-10-03 ENCOUNTER — Other Ambulatory Visit: Payer: Self-pay

## 2018-10-03 MED ORDER — GABAPENTIN 600 MG PO TABS: ORAL_TABLET | ORAL | 1 refills | Status: DC

## 2018-12-18 DIAGNOSIS — N400 Enlarged prostate without lower urinary tract symptoms: Secondary | ICD-10-CM | POA: Diagnosis not present

## 2018-12-18 DIAGNOSIS — M2550 Pain in unspecified joint: Secondary | ICD-10-CM | POA: Diagnosis not present

## 2018-12-18 DIAGNOSIS — Z0000 Encounter for general adult medical examination without abnormal findings: Secondary | ICD-10-CM | POA: Diagnosis not present

## 2018-12-20 DIAGNOSIS — Z125 Encounter for screening for malignant neoplasm of prostate: Secondary | ICD-10-CM | POA: Diagnosis not present

## 2018-12-20 DIAGNOSIS — Z0000 Encounter for general adult medical examination without abnormal findings: Secondary | ICD-10-CM | POA: Diagnosis not present

## 2018-12-20 DIAGNOSIS — M2550 Pain in unspecified joint: Secondary | ICD-10-CM | POA: Diagnosis not present

## 2019-08-16 ENCOUNTER — Ambulatory Visit: Payer: Commercial Managed Care - PPO | Admitting: Neurology

## 2019-11-06 NOTE — Progress Notes (Signed)
PATIENT: Shaun Bradley DOB: April 23, 1961  REASON FOR VISIT: follow up HISTORY FROM: patient  HISTORY OF PRESENT ILLNESS: Today 11/07/19  Shaun Bradley is a 59 year old male with history of bilateral foot pain that has been chronic, has seem to be consistent with mechanical source such as plantar fasciitis.  He remains on gabapentin, but is running low.  He has not been seen at this office since December 2019.  When last seen, he was sent for a podiatry consultation.  Gabapentin is helpful.  He continues to complain of sharp, pinpricks with prolonged standing.  In the mornings when first standing, the pain may be so severe, he may collapse.  He switched jobs, he is working at [employer], no longer wearing steel toed boots, this has not made much change.  His feet feel better when he gets off them. He sleeps well at night, he does report some coldness to his feet at night.  He needs a refill on gabapentin.  Is not interested in another referral to podiatry.  He feels his symptoms have overall remained stable, or not getting worse.  He presents today for evaluation unaccompanied.  HISTORY 08/14/2018 Dr. Jannifer Franklin: Shaun Bradley is a 59 year old right-handed white male with a history of bilateral foot pain that is quite chronic.  The patient has been seen through podiatry but he has not been followed for over 2 years.  The patient cannot remember the doctor who saw him previously.  The patient reports bilateral foot pain that appears to be most consistent with a mechanical source of pain such as plantar fasciitis.  He feels much better when he is off of his feet, when he first bears weight in the morning, the pain is so severe that he may collapse at times, unfortunately his current job requires quite a bit of walking, he claims that he walks 5 miles a day.  The patient feels worse as the day goes on, when he is able to get off of his feet, he feels better.  The patient does report some numbness in the distal  portions of the feet at times.  The patient denies any back pain or pain down the legs.  He currently is on gabapentin taking 600 mg twice daily which offer some benefit.  He returns to this office for an evaluation.    REVIEW OF SYSTEMS: Out of a complete 14 system review of symptoms, the patient complains only of the following symptoms, and all other reviewed systems are negative.  N/a  ALLERGIES: Allergies  Allergen Reactions  . Penicillin G Rash    HOME MEDICATIONS: Outpatient Medications Prior to Visit  Medication Sig Dispense Refill  . aspirin EC 81 MG tablet Take 81 mg by mouth daily.     Marland Kitchen BIOTIN PO Take 1 Dose by mouth daily.     Hendricks Limes CONTINUING MONTH PAK 1 MG tablet See admin instructions. see package  0  . GLUCOSAMINE-CHONDROITIN PO Take 1 Dose by mouth daily.    . Multiple Vitamins-Minerals (MULTIVITAMIN PO) Take 1 tablet by mouth daily. Centrum De Beque    . Specialty Vitamins Products (PROSTATE PO) Take 1 capsule by mouth daily.    . vitamin B-12 (CYANOCOBALAMIN) 1000 MCG tablet Take by mouth.    . vitamin C (ASCORBIC ACID) 500 MG tablet Take 500 mg by mouth daily.     Marland Kitchen VITAMIN E PO Take 1 capsule by mouth daily.    Marland Kitchen gabapentin (NEURONTIN) 600 MG tablet One tablet in  the morning and evening, 1/2 tablet at midday 225 tablet 1   No facility-administered medications prior to visit.    PAST MEDICAL HISTORY: Past Medical History:  Diagnosis Date  . Benign prostatic hypertrophy without urinary obstruction 09/11/2014  . Current tobacco use 09/15/2015   Overview:  Have discussed dc multiple times   . Encounter for general adult medical examination without abnormal findings 09/15/2015   Last Assessment & Plan:  Colonoscopy with one adenoma 2012 and due 2017. Tobacco use noted.    . Plantar fasciitis 08/12/2017    PAST SURGICAL HISTORY: Past Surgical History:  Procedure Laterality Date  . COLONOSCOPY    . dental procedure     1984  . TOOTH EXTRACTION     2015     FAMILY HISTORY: Family History  Problem Relation Age of Onset  . Prostate cancer Neg Hx   . Bladder Cancer Neg Hx   . Kidney disease Neg Hx     SOCIAL HISTORY: Social History   Socioeconomic History  . Marital status: Divorced    Spouse name: Not on file  . Number of children: 3  . Years of education: Some college  . Highest education level: Not on file  Occupational History  . Occupation: Estate manager/land agent  Tobacco Use  . Smoking status: Current Every Day Smoker    Packs/day: 1.00    Types: Cigarettes  . Smokeless tobacco: Never Used  Substance and Sexual Activity  . Alcohol use: Yes    Alcohol/week: 36.0 standard drinks    Types: 36 Standard drinks or equivalent per week  . Drug use: No  . Sexual activity: Not on file  Other Topics Concern  . Not on file  Social History Narrative   Lives with mother   Caffeine use: coffee (2 cups) in the morning   Right handed   Social Determinants of Health   Financial Resource Strain:   . Difficulty of Paying Living Expenses: Not on file  Food Insecurity:   . Worried About Charity fundraiser in the Last Year: Not on file  . Ran Out of Food in the Last Year: Not on file  Transportation Needs:   . Lack of Transportation (Medical): Not on file  . Lack of Transportation (Non-Medical): Not on file  Physical Activity:   . Days of Exercise per Week: Not on file  . Minutes of Exercise per Session: Not on file  Stress:   . Feeling of Stress : Not on file  Social Connections:   . Frequency of Communication with Friends and Family: Not on file  . Frequency of Social Gatherings with Friends and Family: Not on file  . Attends Religious Services: Not on file  . Active Member of Clubs or Organizations: Not on file  . Attends Archivist Meetings: Not on file  . Marital Status: Not on file  Intimate Partner Violence:   . Fear of Current or Ex-Partner: Not on file  . Emotionally Abused: Not on file  .  Physically Abused: Not on file  . Sexually Abused: Not on file   PHYSICAL EXAM  Vitals:   11/07/19 0947  BP: 118/78  Pulse: 61  Temp: 97.7 F (36.5 C)  TempSrc: Temporal  SpO2: 97%  Weight: 128 lb (58.1 kg)  Height: 5\' 8"  (1.727 m)   Body mass index is 19.46 kg/m.  Generalized: Well developed, in no acute distress   Neurological examination  Mentation: Alert oriented to time, place, history taking.  Follows all commands speech and language fluent Cranial nerve II-XII: Pupils were equal round reactive to light. Extraocular movements were full, visual field were full on confrontational test. Facial sensation and strength were normal.  Head turning and shoulder shrug were normal and symmetric. Motor: The motor testing reveals 5 over 5 strength of all 4 extremities. Good symmetric motor tone is noted throughout.  Sensory: Pinprick, soft touch, and vibration sensation are intact to lower extremities Coordination: Cerebellar testing reveals good finger-nose-finger and heel-to-shin bilaterally.  Gait and station: Gait is normal. Tandem gait is normal. Romberg is negative. No drift is seen.  Did not feel comfortable walking on heels or toes, due to history of ligament issues. Reflexes: Deep tendon reflexes are symmetric but depressed throughout  DIAGNOSTIC DATA (LABS, IMAGING, TESTING) - I reviewed patient records, labs, notes, testing and imaging myself where available.  No results found for: WBC, HGB, HCT, MCV, PLT No results found for: NA, K, CL, CO2, GLUCOSE, BUN, CREATININE, CALCIUM, PROT, ALBUMIN, AST, ALT, ALKPHOS, BILITOT, GFRNONAA, GFRAA No results found for: CHOL, HDL, LDLCALC, LDLDIRECT, TRIG, CHOLHDL No results found for: HGBA1C No results found for: VITAMINB12 No results found for: TSH    ASSESSMENT AND PLAN 59 y.o. year old male  has a past medical history of Benign prostatic hypertrophy without urinary obstruction (09/11/2014), Current tobacco use (09/15/2015),  Encounter for general adult medical examination without abnormal findings (09/15/2015), and Plantar fasciitis (08/12/2017). here with:  1.  Bilateral foot pain, mechanical source of pain  He continues to have chronic complaints of foot pain, consistent with a mechanical source of pain in the feet, possibly plantar fasciitis.  He was previously sent for podiatry consultation, does not seem to have been not revealing.  He does not wish to have another referral placed, cannot afford at this time.  I will refill his gabapentin, 600 mg tablet twice daily, 300 mg capsule midday (easier for him to keep in his locker at work).  He will follow-up here in 1 year or sooner if needed, he will discuss with his PCP, see if they can continue to fill the gabapentin following going forward.   I spent 15 minutes with the patient. 50% of this time was spent discussing his plan of care.   Butler Denmark, AGNP-C, DNP 11/07/2019, 10:14 AM [facility] [address], [place], [place] [address] [phone]

## 2019-11-07 ENCOUNTER — Ambulatory Visit: Payer: Self-pay | Admitting: Neurology

## 2019-11-07 ENCOUNTER — Other Ambulatory Visit: Payer: Self-pay

## 2019-11-07 ENCOUNTER — Encounter: Payer: Self-pay | Admitting: Neurology

## 2019-11-07 VITALS — BP 118/78 | HR 61 | Temp 97.7°F | Ht 68.0 in | Wt 128.0 lb

## 2019-11-07 DIAGNOSIS — M79671 Pain in right foot: Secondary | ICD-10-CM

## 2019-11-07 DIAGNOSIS — M79672 Pain in left foot: Secondary | ICD-10-CM

## 2019-11-07 MED ORDER — GABAPENTIN 300 MG PO CAPS: ORAL_CAPSULE | ORAL | 1 refills | Status: AC

## 2019-11-07 MED ORDER — GABAPENTIN 600 MG PO TABS: 600.0000 mg | ORAL_TABLET | Freq: Two times a day (BID) | ORAL | 1 refills | Status: AC

## 2019-11-07 NOTE — Patient Instructions (Signed)
It was good to see you today Take gabapentin 600 mg twice daily, 300 mg midday Consider podiatry, let me know See you back in 1 year

## 2019-11-08 NOTE — Progress Notes (Signed)
I have read the note, and I agree with the clinical assessment and plan.  Tanish Sinkler K Shirlean Berman   

## 2020-06-29 ENCOUNTER — Emergency Department: Payer: Managed Care, Other (non HMO)

## 2020-06-29 ENCOUNTER — Other Ambulatory Visit: Payer: Self-pay

## 2020-06-29 DIAGNOSIS — Y999 Unspecified external cause status: Secondary | ICD-10-CM | POA: Insufficient documentation

## 2020-06-29 DIAGNOSIS — F17210 Nicotine dependence, cigarettes, uncomplicated: Secondary | ICD-10-CM | POA: Insufficient documentation

## 2020-06-29 DIAGNOSIS — W01118A Fall on same level from slipping, tripping and stumbling with subsequent striking against other sharp object, initial encounter: Secondary | ICD-10-CM | POA: Diagnosis not present

## 2020-06-29 DIAGNOSIS — Z79899 Other long term (current) drug therapy: Secondary | ICD-10-CM | POA: Diagnosis not present

## 2020-06-29 DIAGNOSIS — Y9389 Activity, other specified: Secondary | ICD-10-CM | POA: Insufficient documentation

## 2020-06-29 DIAGNOSIS — S51812A Laceration without foreign body of left forearm, initial encounter: Secondary | ICD-10-CM | POA: Insufficient documentation

## 2020-06-29 DIAGNOSIS — Z7982 Long term (current) use of aspirin: Secondary | ICD-10-CM | POA: Insufficient documentation

## 2020-06-29 DIAGNOSIS — Z23 Encounter for immunization: Secondary | ICD-10-CM | POA: Insufficient documentation

## 2020-06-29 DIAGNOSIS — Y929 Unspecified place or not applicable: Secondary | ICD-10-CM | POA: Diagnosis not present

## 2020-06-29 NOTE — ED Triage Notes (Addendum)
Pt states fell into a [place] this pm chasing a kitten. Pt with possible foreign body, stick" in forearm. Pt with laceration noted to forearm and swelling. Cms intact to fingers. Arm is normal color, pt is intoxicated.

## 2020-06-30 ENCOUNTER — Emergency Department
Admission: EM | Admit: 2020-06-30 | Discharge: 2020-06-30 | Disposition: A | Discharge status: Home / Self Care | Payer: Managed Care, Other (non HMO) | Attending: Emergency Medicine | Admitting: Emergency Medicine

## 2020-06-30 DIAGNOSIS — S51812A Laceration without foreign body of left forearm, initial encounter: Secondary | ICD-10-CM

## 2020-06-30 MED ORDER — TETANUS-DIPHTH-ACELL PERTUSSIS 5-2.5-18.5 LF-MCG/0.5 IM SUSY
0.5000 mL | PREFILLED_SYRINGE | Freq: Once | INTRAMUSCULAR | Status: AC
  Administered 2020-06-30: 0.5 mL via INTRAMUSCULAR
  Filled 2020-06-30: qty 0.5

## 2020-06-30 NOTE — ED Notes (Signed)
Patient called back to the ED and stated that he was going to wait in his vehicle with his mother and would staff please call him when a treatment room was available.  Assured patient that we would call him when treatment room was available.

## 2020-06-30 NOTE — ED Notes (Signed)
Patient given update regarding wait time, verbalized understanding. 

## 2020-06-30 NOTE — ED Notes (Signed)
EDP bedside. Dressing in place. EDP educating pt with care instructions.

## 2020-06-30 NOTE — ED Provider Notes (Addendum)
[facility] Emergency Department Provider Note   ____________________________________________   First MD Initiated Contact with Patient 06/30/20 [phone]     (approximate)  I have reviewed the triage vital signs and the nursing notes.   HISTORY  Chief Complaint Fall    HPI Shaun Bradley is a 59 y.o. male with a stated past medical history of tobacco abuse who presents after a mechanical fall into the [facility] proximately 2 hours prior to arrival sustaining a laceration to the dorsum of the left forearm.  Patient states that he immediately began having significant swelling proximal to this laceration that was concerning for possible retained foreign body.  Patient denies any blood thinner use but states that he does take aspirin daily.          Past Medical History:  Diagnosis Date  . Benign prostatic hypertrophy without urinary obstruction 09/11/2014  . Current tobacco use 09/15/2015   Overview:  Have discussed dc multiple times   . Encounter for general adult medical examination without abnormal findings 09/15/2015   Last Assessment & Plan:  Colonoscopy with one adenoma 2012 and due 2017. Tobacco use noted.    . Plantar fasciitis 08/12/2017    Patient Active Problem List   Diagnosis Date Noted  . Plantar fasciitis 08/12/2017  . Bilateral foot pain 03/31/2017  . Encounter for general adult medical examination without abnormal findings 09/15/2015  . Current tobacco use 09/15/2015  . Benign prostatic hypertrophy without urinary obstruction 09/11/2014    Past Surgical History:  Procedure Laterality Date  . COLONOSCOPY    . dental procedure     1984  . TOOTH EXTRACTION     2015    Prior to Admission medications   Medication Sig Start Date End Date Taking? Authorizing Provider  aspirin EC 81 MG tablet Take 81 mg by mouth daily.     [provider]  BIOTIN PO Take 1 Dose by mouth daily.     [provider]  CHANTIX CONTINUING  MONTH PAK 1 MG tablet See admin instructions. see package 02/11/17   [provider]  gabapentin (NEURONTIN) 300 MG capsule Take 1 tablet midday 11/07/19   Suzzanne Cloud, NP  gabapentin (NEURONTIN) 600 MG tablet Take 1 tablet (600 mg total) by mouth 2 (two) times daily. Take 1 in the morning, take 1 in the evening 11/07/19   Suzzanne Cloud, NP  GLUCOSAMINE-CHONDROITIN PO Take 1 Dose by mouth daily.    [provider]  Multiple Vitamins-Minerals (MULTIVITAMIN PO) Take 1 tablet by mouth daily. Hawkeye    [provider]  Specialty Vitamins Products (PROSTATE PO) Take 1 capsule by mouth daily.    [provider]  vitamin B-12 (CYANOCOBALAMIN) 1000 MCG tablet Take by mouth.    [provider]  vitamin C (ASCORBIC ACID) 500 MG tablet Take 500 mg by mouth daily.     [provider]  VITAMIN E PO Take 1 capsule by mouth daily.    [provider]    Allergies Penicillin g  Family History  Problem Relation Age of Onset  . Prostate cancer Neg Hx   . Bladder Cancer Neg Hx   . Kidney disease Neg Hx     Social History Social History   Tobacco Use  . Smoking status: Current Every Day Smoker    Packs/day: 1.00    Types: Cigarettes  . Smokeless tobacco: Never Used  Vaping Use  . Vaping Use: Never used  Substance Use Topics  . Alcohol use: Yes    Alcohol/week: 36.0 standard drinks    Types: 36 Standard drinks or equivalent per week  . Drug use: No    Review of Systems Constitutional: No fever/chills Eyes: No visual changes. ENT: No sore throat. Cardiovascular: Denies chest pain. Respiratory: Denies shortness of breath. Gastrointestinal: No abdominal pain.  No nausea, no vomiting.  No diarrhea. Genitourinary: Negative for dysuria. Musculoskeletal: Negative for acute arthralgias Skin: Negative for rash.  Positive for laceration to left forearm Neurological: Negative for headaches, weakness/numbness/paresthesias in any  extremity Psychiatric: Negative for suicidal ideation/homicidal ideation   ____________________________________________   PHYSICAL EXAM:  VITAL SIGNS: ED Triage Vitals  Enc Vitals Group     BP 06/29/20 2327 137/74     Pulse Rate 06/29/20 2327 63     Resp 06/29/20 2327 16     Temp 06/29/20 2327 99 F (37.2 C)     Temp Source 06/29/20 2327 Oral     SpO2 06/29/20 2327 100 %     Weight 06/29/20 2328 130 lb (59 kg)     Height 06/29/20 2328 5\' 7"  (1.702 m)     Head Circumference --      Peak Flow --      Pain Score 06/29/20 2328 8     Pain Loc --      Pain Edu? --      Excl. in [place]? --   Constitutional: Alert and oriented. Well appearing and in no acute distress. Eyes: Conjunctivae are normal. PERRL. Head: Atraumatic. Nose: No congestion/rhinnorhea. Mouth/Throat: Mucous membranes are moist. Neck: No stridor Cardiovascular: Grossly normal heart sounds.  Good peripheral circulation. Respiratory: Normal respiratory effort.  No retractions. Gastrointestinal: Soft and nontender. No distention. Musculoskeletal: No obvious deformities Neurologic:  Normal speech and language. No gross focal neurologic deficits are appreciated. Skin:  Skin is warm and dry. No rash noted.  4 cm stellate skin tear to the mid dorsum of the left forearm with hemostasis Psychiatric: Mood and affect are normal. Speech and behavior are normal.  ____________________________________________   LABS (all labs ordered are listed, but only abnormal results are displayed)  Labs Reviewed - No data to display ____________________________________________  RADIOLOGY  ED MD interpretation: 2 view x-ray of the right forearm shows no evidence of retained body or fracture/dislocation.  Official radiology report(s): DG Forearm Right  Result Date: 06/30/2020 CLINICAL DATA:  Initial evaluation for acute injury, laceration. Evaluate for foreign body. EXAM: RIGHT FOREARM - 2 VIEW COMPARISON:  None. FINDINGS: Bandaging  material overlies the proximal forearm, presumably related to laceration. No radiopaque foreign body. No acute fracture dislocation. 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 IMPRESSION: 1. Presumed soft tissue laceration at the proximal forearm. No radiopaque foreign body. 2. No acute osseous abnormality. Electronically Signed   By: Jeannine Boga M.D.   On: 06/30/2020 00:02   Bedside ultrasound performed by this provider also shows no evidence of acute foreign body but does show a large hematoma proximal to the skin tear ____________________________________________   PROCEDURES  Procedure(s) performed (including Critical Care):  Marland KitchenMarland KitchenLaceration Repair  Date/Time: 08/07/2020 8:18 AM Performed by: Naaman Plummer, MD Authorized by: Naaman Plummer, MD   Consent:    Consent obtained:  Verbal   Consent given by:  Patient   Risks, benefits, and alternatives were discussed: yes     Risks discussed:  Infection, pain, retained foreign body, poor wound healing and need for additional repair   Alternatives  discussed:  No treatment, delayed treatment and observation Universal protocol:    Procedure explained and questions answered to patient or proxy's satisfaction: yes     Relevant documents present and verified: yes     Test results available: yes     Imaging studies available: yes     Required blood products, implants, devices, and special equipment available: no     Site/side marked: no     Immediately prior to procedure, a time out was called: yes     Patient identity confirmed:  Verbally with patient Anesthesia:    Anesthesia method:  None Laceration details:    Location:  Shoulder/arm   Shoulder/arm location:  L lower arm   Length (cm):  4   Depth (mm):  3 Pre-procedure details:    Preparation:  Patient was prepped and draped in usual sterile fashion and imaging obtained to evaluate for foreign bodies Exploration:    Limited defect created (wound  extended): no     Hemostasis achieved with:  Direct pressure   Imaging obtained: bedside ultrasound     Imaging outcome: foreign body not noted     Wound exploration: wound explored through full range of motion and entire depth of wound visualized     Wound extent: no fascia violation noted, no foreign bodies/material noted and no muscle damage noted     Contaminated: no   Treatment:    Area cleansed with:  Saline   Amount of cleaning:  Standard   Irrigation solution:  Sterile saline   Irrigation volume:  400   Irrigation method:  Pressure wash   Visualized foreign bodies/material removed: no     Debridement:  None   Undermining:  None   Scar revision: no   Skin repair:    Repair method:  Steri-Strips   Number of Steri-Strips:  4 Approximation:    Approximation:  Close Repair type:    Repair type:  Simple Post-procedure details:    Dressing:  Non-adherent dressing   Procedure completion:  Tolerated     ____________________________________________   INITIAL IMPRESSION / ASSESSMENT AND PLAN / ED COURSE  As part of my medical decision making, I reviewed the following data within the [facility] notes reviewed and incorporated, Labs reviewed, EKG interpreted, Old chart reviewed, Radiograph reviewed and Notes from prior ED visits reviewed and incorporated        Patient had a laceration that was repaired in the ED after copious irrigation.  Please see laceration procedure note for further details.  After exploration of the wound, there was no evidence of a retained foreign body. No evidence of underlying fracture. TDAP: Updated Interventions: Defer ABX at this time given location, event time, and patient without surrounding signs of infection. Disposition: Discharge. Patient has been given strict wound return precautions and instructions to follow up with their PMD in 2 days for a wound recheck.       ____________________________________________   FINAL CLINICAL IMPRESSION(S) / ED DIAGNOSES  Final diagnoses:  Skin tear of left forearm without complication, initial encounter     ED Discharge Orders    None       Note:  This document was prepared using Dragon voice recognition software and may include unintentional dictation errors.   Naaman Plummer, MD 06/30/20 [id number]    Naaman Plummer, MD 08/07/20 [phone]

## 2020-06-30 NOTE — ED Notes (Signed)
ED Provider at bedside. 

## 2020-06-30 NOTE — ED Notes (Signed)
Patient standing in middle of lobby yelling at this RN about another patient being in pain.  Explained to patient that I was checking on the patient.  Again explained to patient that he could not have a visitor in the waiting room with him, and that if he wanted to wait in the care with his mother I would be happy to call him when a treatment room became available.  Patient ask his mother to stand up and then told her that staff was kicking her out.  This RN again explained to patient and his mother that I was not kicking her out that it was hospital policy that there were no visitor in the waiting room and that she could be in the treatment room with him.  Explained to patient that if he felt more comfortable staying with his mother in the car I would be happy to call him when a treatment room became available.  Patient then stormed out of waiting room.

## 2021-01-22 ENCOUNTER — Emergency Department
Admission: EM | Admit: 2021-01-22 | Discharge: 2021-01-22 | Disposition: A | Discharge status: Home / Self Care | Payer: No Typology Code available for payment source | Source: Ambulatory Visit | Attending: Emergency Medicine | Admitting: Emergency Medicine

## 2021-01-22 ENCOUNTER — Encounter (HOSPITAL_COMMUNITY): Payer: Self-pay

## 2021-01-22 ENCOUNTER — Emergency Department (HOSPITAL_COMMUNITY): Payer: No Typology Code available for payment source

## 2021-01-22 ENCOUNTER — Other Ambulatory Visit: Payer: Self-pay

## 2021-01-22 DIAGNOSIS — R531 Weakness: Secondary | ICD-10-CM

## 2021-01-22 DIAGNOSIS — U071 COVID-19: Secondary | ICD-10-CM | POA: Insufficient documentation

## 2021-01-22 DIAGNOSIS — R000 Tachycardia, unspecified: Secondary | ICD-10-CM | POA: Insufficient documentation

## 2021-01-22 DIAGNOSIS — R9431 Abnormal electrocardiogram [ECG] [EKG]: Secondary | ICD-10-CM

## 2021-01-22 DIAGNOSIS — F17210 Nicotine dependence, cigarettes, uncomplicated: Secondary | ICD-10-CM | POA: Insufficient documentation

## 2021-01-22 HISTORY — DX: Disorder of kidney and ureter, unspecified: N28.9

## 2021-01-22 HISTORY — DX: Noninfective gastroenteritis and colitis, unspecified: K52.9

## 2021-01-22 HISTORY — DX: Essential (primary) hypertension: I10

## 2021-01-22 HISTORY — DX: Type 2 diabetes mellitus without complications (CMS HCC): E11.9

## 2021-01-22 HISTORY — DX: Type 2 diabetes mellitus without complications: E11.9

## 2021-01-22 LAB — URINALYSIS, MACRO/MICRO
BILIRUBIN: NEGATIVE mg/dL
BLOOD: NEGATIVE mg/dL
GLUCOSE: 100 mg/dL — AB
KETONES: NEGATIVE mg/dL
LEUKOCYTES: NEGATIVE WBCs/uL
NITRITE: NEGATIVE
PH: 6.5 (ref 5.0–9.0)
PROTEIN: NEGATIVE mg/dL
SPECIFIC GRAVITY: 1.011 (ref 1.001–1.030)
UROBILINOGEN: 0.2 mg/dL (ref 0.2–1.0)

## 2021-01-22 LAB — ECG 12 LEAD
Atrial Rate: 101 {beats}/min
Calculated P Axis: 37 degrees
Calculated R Axis: 44 degrees
Calculated T Axis: 61 degrees
PR Interval: 140 ms
QRS Duration: 80 ms
QT Interval: 350 ms
QTC Calculation: 453 ms
Ventricular rate: 101 {beats}/min

## 2021-01-22 LAB — COMPREHENSIVE METABOLIC PANEL, NON-FASTING
ALBUMIN/GLOBULIN RATIO: 1.3 (ref >=1.0)
ALBUMIN: 4.3 g/dL (ref 3.5–5.2)
ALKALINE PHOSPHATASE: 48 U/L (ref 41–133)
ALT (SGPT): 23 U/L (ref 0–55)
ANION GAP: 7 mmol/L (ref 6–15)
AST (SGOT): 19 U/L (ref 5–34)
BILIRUBIN TOTAL: 0.3 mg/dL (ref 0.2–1.2)
BUN: 12 mg/dL (ref 7–21)
CALCIUM: 9.4 mg/dL (ref 8.0–10.6)
CHLORIDE: 95 mmol/L — ABNORMAL LOW (ref 98–107)
CO2 TOTAL: 28 mmol/L (ref 21–32)
CREATININE: 1.19 mg/dL (ref 0.80–1.60)
ESTIMATED GFR: >60 mL/min/{1.73_m2}
GLUCOSE: 104 mg/dL — ABNORMAL HIGH (ref 70–100)
POTASSIUM: 4 mmol/L (ref 3.3–5.1)
PROTEIN TOTAL: 7.6 g/dL (ref 6.4–8.3)
SODIUM: 130 mmol/L — ABNORMAL LOW (ref 136–146)

## 2021-01-22 LAB — MAGNESIUM: MAGNESIUM: 2 mg/dL (ref 1.5–2.5)

## 2021-01-22 LAB — COVID-19 ~~LOC~~ MOLECULAR LAB TESTING
INFLUENZA VIRUS TYPE A: NOT DETECTED
INFLUENZA VIRUS TYPE B: NOT DETECTED
SARS-COV-2: DETECTED — CR

## 2021-01-22 LAB — CBC WITH DIFF
HCT: 44 % (ref 38.9–52.0)
HGB: 14.8 g/dL (ref 13.4–17.5)
MCH: 32.8 pg — ABNORMAL HIGH (ref 26.0–32.0)
MCHC: 33.6 g/dL (ref 31.0–35.5)
MCV: 97.6 fL (ref 78.0–100.0)
PLATELETS: 371 10*3/uL (ref 150–400)
RBC: 4.51 10*6/uL (ref 4.50–6.10)
RDW-CV: 14.2 % (ref 11.5–15.5)
WBC: 9.5 10*3/uL (ref 3.7–11.0)

## 2021-01-22 LAB — MANUAL DIFF AND MORPHOLOGY-SYSMEX
BASOPHIL #: <0.1 10*3/uL (ref <=0.20)
BASOPHIL %: 0 %
EOSINOPHIL #: <0.1 10*3/uL (ref <=0.50)
EOSINOPHIL %: 0 %
LYMPHOCYTE #: 1.9 10*3/uL (ref 1.00–4.80)
LYMPHOCYTE %: 18 %
MONOCYTE #: 0.67 10*3/uL (ref 0.20–1.10)
MONOCYTE %: 7 %
MYELOCYTE %: 1 %
NEUTROPHIL #: 6.84 10*3/uL (ref 1.50–7.70)
NEUTROPHIL %: 71 %
NEUTROPHIL BANDS %: 1 %
RBC MORPHOLOGY: NORMAL
REACTIVE LYMPHOCYTE %: 2 %

## 2021-01-22 LAB — LACTIC ACID LEVEL: LACTIC ACID: 1.2 mmol/L (ref 0.5–2.0)

## 2021-01-22 LAB — BLUE TOP TUBE

## 2021-01-22 LAB — LIPASE: LIPASE: 33 U/L (ref 8–78)

## 2021-01-22 LAB — CREATINE KINASE (CK), TOTAL, SERUM: CREATINE KINASE: 65 U/L (ref 30–200)

## 2021-01-22 LAB — TROPONIN I, HIGH SENSITIVITY: TROPONIN I, HIGH SENSITIVITY: 3.7 pg/mL (ref 2.00–20.00)

## 2021-01-22 LAB — B-TYPE NATRIURETIC PEPTIDE: BNP: 36 pg/mL (ref <=99)

## 2021-01-22 MED ORDER — PAXLOVID 300 MG (150 MG X 2)-100 MG TABLETS IN A DOSE PACK
2.0000 | ORAL_TABLET | Freq: Two times a day (BID) | ORAL | 0 refills | Status: AC
Start: 2021-01-22 — End: 2021-01-27

## 2021-01-22 MED ORDER — SODIUM CHLORIDE 0.9 % IV BOLUS
500.0000 mL | INJECTION | Status: AC
Start: 2021-01-22 — End: 2021-01-22
  Administered 2021-01-22: 0 mL via INTRAVENOUS
  Administered 2021-01-22: 500 mL via INTRAVENOUS

## 2021-01-22 MED ORDER — ONDANSETRON HCL (PF) 4 MG/2 ML INJECTION SOLUTION
4.0000 mg | INTRAMUSCULAR | Status: AC
Start: 2021-01-22 — End: 2021-01-22
  Administered 2021-01-22: 4 mg via INTRAVENOUS
  Filled 2021-01-22: qty 2

## 2021-01-22 NOTE — ED Provider Notes (Signed)
[facility] - Emergency Department      Attending Physician: Dr. Hetty BlendAfaq Day  CC:  Chief Complaint   Patient presents with   . Weakness       HPI:  [facility] Day is a 60 y.o. male who presents to the Emergency Room with c/o not feeling well for 2-3 weeks.  Patient said that the he is weak he is shaky and he has body aches and pains and tired.  He has slight nausea.  He denies cough or sputum production he denies any fever.  He has no chest pain or shortness of breath no abdominal pain.    Review of Systems:  Constitutional:  Constitutional symptoms negative except what describe in history.  Skin:  Negative except what Idescribed in his history.  HENT:Negative except what I described in his history.  Eyes: Negative except what a described in his history.  Cardio: Negative except what I described in his history.  Respiratory:Negative except what I described in his history.  GI: Negative except what I a described in his history.  GU: Negative except what I described in his history.  MSK: Negative except what I described in his history.  Neuro: Negative except what I described in his history.   All other systems reviewed and are negative or as noted in HPI.    History:  PMH:    Past Medical History:   Diagnosis Date   . Colitis    . Diabetes mellitus type II    . Diabetes mellitus, type 2 (CMS HCC)    . HTN (hypertension)    . Hyperlipidemia    . Kidney disease          Previous Medications    ALPRAZOLAM (XANAX) 0.5 MG TAB    Take by mouth Every night as needed 1 MG    BUSPIRONE (BUSPAR) 10 MG ORAL TABLET    Take 10 mg by mouth Three times a Day PRN    CYCLOBENZAPRINE (FLEXERIL) 10 MG ORAL TABLET    Take 10 mg by mouth Every 8 hours as needed for Muscle spasms    LISINOPRIL (PRINIVIL) 10 MG ORAL TABLET    Take 10 mg by mouth Once a Day    METOPROLOL TARTRATE (LOPRESSOR) 50 MG ORAL TABLET    Take 50 mg by mouth Once a Day       PSH:    Past Surgical History:   Procedure Laterality Date   . FOOT SURGERY           Social  Hx:    Social History     Socioeconomic History   . Marital status: Divorced     Spouse name: Not on file   . Number of children: Not on file   . Years of education: Not on file   . Highest education level: Not on file   Occupational History   . Not on file   Tobacco Use   . Smoking status: Current Every Day Smoker     Packs/Day: 0.25   . Smokeless tobacco: Not on file   Substance and Sexual Activity   . Alcohol use: Not Currently   . Drug use: Never   . Sexual activity: Not on file   Other Topics Concern   . Abuse/Domestic Violence Not Asked   . Caffeine Concern Not Asked   . Calcium intake adequate Not Asked   . Computer Use Not Asked   . Drives Not Asked   . Exercise Concern Not Asked   .  Helmet Use Not Asked   . Seat Belt Not Asked   . Special Diet Not Asked   . Sunscreen used Not Asked   . Uses Cane Not Asked   . Uses walker Not Asked   . Uses wheelchair Not Asked   . Right hand dominant Not Asked   . Left hand dominant Yes   . Ambidextrous Not Asked   . Shift Work Not Asked   . Unusual Sleep-Wake Schedule Not Asked   Social History Narrative   . Not on file     Social Determinants of Health     Financial Resource Strain: Not on file   Food Insecurity: Not on file   Transportation Needs: Not on file   Physical Activity: Not on file   Stress: Not on file   Intimate Partner Violence: Not on file   Housing Stability: Not on file     Family Hx: No family history on file.  Allergies: No Known Allergies    Above history reviewed with patient, changes are as documented.    Physical Exam:   Nursing note and vitals reviewed.  ED Triage Vitals [01/22/21 0920]   BP (Non-Invasive) (!) 152/94   Heart Rate 100   Respiratory Rate (!) 24   Temperature 37 C (98.6 F)   SpO2 99 %   Weight 67.1 kg (148 lb)   Height 1.651 m (5\' 5" )       Constitutional: Pt is alert and oriented and appears well-developed and well-nourished. No acute distress.   Head.  Atraumatic.  Pupils.  Equal and reacting to light.  HENT:   Mouth/Throat:  Oropharynx is clear and moist.  Tonsils not enlarged no inflammation.  Eyes: Conjunctivae and extraocular motions are normal. Pupils are equal, round, and reactive to light.   Neck: Normal range of motion. Neck supple.  No JVDs or bruits trachea midline  Cardiovascular:  Heart S1-S2 regular no gallop or murmur.  Normal rate, regular rhythm and intact distal pulses.    Pulmonary/Chest: Effort normal. No respiratory distress. Breath sounds normal.  Lungs are clear no rales rhonchi or wheezing.  Abdominal: Abdomen is soft, nontender, nondistended.  No rebound or guarding noted.  Bowel sounds are present.  Liver spleen and kidneys are not palpable.  Musculoskeletal: Normal range of motion.  Examination of lower extremities shows no edema.  Neurological: Pt is alert and oriented. Grossly intact. Moving all extremities. Facies symmetric.  No motor or sensory deficits.  Skin: Skin is warm and dry without rash.  Color normal.      Course:   Impression: Pt presenting c/o   Chief Complaint   Patient presents with   . Weakness       Plan:Will obtain the following labs/imaging and give patient the following medications to alleviate symptoms:  Orders Placed This Encounter   . XR AP MOBILE CHEST   . CBC/DIFF   . COMPREHENSIVE METABOLIC PANEL, NON-FASTING   . LIPASE   . MAGNESIUM   . URINALYSIS WITH REFLEX MICROSCOPIC AND CULTURE IF POSITIVE   . LACTIC ACID LEVEL   . CBC WITH DIFF   . URINALYSIS, MACRO/MICRO   . CANCELED: MAGNESIUM   . B-TYPE NATRIURETIC PEPTIDE   . CREATINE KINASE (CK), TOTAL, SERUM   . TROPONIN I, HIGH SENSITIVITY   . COVID-19 - SCREENING - Admission (NON-PUI)   . EXTRA TUBES   . BLUE TOP TUBE   . MANUAL DIFF AND MORPHOLOGY-SYSMEX   . ECG 12 LEAD   .  NS bolus infusion 500 mL   . ondansetron (ZOFRAN) 2 mg/mL injection   . nirmatrelvir-ritonavir (PAXLOVID, EUA,) 150 mg x 2- 100 mg Oral Tablet     Laboratory Results:    Results for orders placed or performed during the hospital encounter of 01/22/21 (from the past  12 hour(s))   COMPREHENSIVE METABOLIC PANEL, NON-FASTING   Result Value Ref Range    SODIUM 130 (L) 136 - 146 mmol/L    POTASSIUM 4.0 3.3 - 5.1 mmol/L    CHLORIDE 95 (L) 98 - 107 mmol/L    CO2 TOTAL 28 21 - 32 mmol/L    ANION GAP 7 6 - 15 mmol/L    BUN 12 7 - 21 mg/dL    CREATININE 8.10 1.75 - 1.60 mg/dL    ESTIMATED GFR >10 CH/ENI/7.78E^4    ALBUMIN 4.3 3.5 - 5.2 g/dL    CALCIUM 9.4 8.0 - 23.5 mg/dL    GLUCOSE 361 (H) 70 - 100 mg/dL    ALKALINE PHOSPHATASE 48 41 - 133 U/L    ALT (SGPT) 23 0 - 55 U/L    AST (SGOT) 19 5 - 34 U/L    BILIRUBIN TOTAL 0.3 0.2 - 1.2 mg/dL    PROTEIN TOTAL 7.6 6.4 - 8.3 g/dL    ALBUMIN/GLOBULIN RATIO 1.3 >=1.0   LIPASE   Result Value Ref Range    LIPASE 33 8 - 78 U/L   MAGNESIUM   Result Value Ref Range    MAGNESIUM 2.0 1.5 - 2.5 mg/dL   CBC WITH DIFF   Result Value Ref Range    WBC 9.5 3.7 - 11.0 x10^3/uL    RBC 4.51 4.50 - 6.10 x10^6/uL    HGB 14.8 13.4 - 17.5 g/dL    HCT 44.3 15.4 - 00.8 %    MCV 97.6 78.0 - 100.0 fL    MCH 32.8 (H) 26.0 - 32.0 pg    MCHC 33.6 31.0 - 35.5 g/dL    RDW-CV 67.6 19.5 - 09.3 %    PLATELETS 371 150 - 400 x10^3/uL   B-TYPE NATRIURETIC PEPTIDE   Result Value Ref Range    BNP 36 <=99 pg/mL   CREATINE KINASE (CK), TOTAL, SERUM   Result Value Ref Range    CREATINE KINASE 65 30 - 200 U/L   TROPONIN I, HIGH SENSITIVITY   Result Value Ref Range    TROPONIN I, HIGH SENSITIVITY 3.70 2.00 - 20.00 pg/mL   MANUAL DIFF AND MORPHOLOGY-SYSMEX   Result Value Ref Range    NEUTROPHIL % 71 %    LYMPHOCYTE %  18 %    MONOCYTE % 7 %    EOSINOPHIL % 0 %    BASOPHIL % 0 %    NEUTROPHIL BANDS % 1 %    MYELOCYTE % 1 %    REACTIVE LYMPHOCYTE % 2 %    NEUTROPHIL # 6.84 1.50 - 7.70 x10^3/uL    LYMPHOCYTE # 1.90 1.00 - 4.80 x10^3/uL    MONOCYTE # 0.67 0.20 - 1.10 x10^3/uL    EOSINOPHIL # <0.10 <=0.50 x10^3/uL    BASOPHIL # <0.10 <=0.20 x10^3/uL    RBC MORPHOLOGY Normal RBC and PLT Morphology    LACTIC ACID LEVEL   Result Value Ref Range    LACTIC ACID 1.2 0.5 - 2.0 mmol/L   URINALYSIS,  MACRO/MICRO   Result Value Ref Range    COLOR Yellow Yellow    APPEARANCE Clear Clear    SPECIFIC GRAVITY 1.011 1.001 - 1.030  PH 6.5 5.0 - 9.0    PROTEIN Negative Negative mg/dL    GLUCOSE 478  (A) Negative mg/dL    KETONES Negative Negative mg/dL    UROBILINOGEN 0.2  0.2 - 1.0 mg/dL    BILIRUBIN Negative Negative mg/dL    BLOOD Negative Negative mg/dL    NITRITE Negative Negative    LEUKOCYTES Negative Negative WBCs/uL   COVID-19 - SCREENING - Admission (NON-PUI)   Result Value Ref Range    SARS-COV-2 Detected (AA) Not Detected    INFLUENZA VIRUS TYPE A Not Detected Not Detected    INFLUENZA VIRUS TYPE B Not Detected Not Detected        Radiographical Imaging:        EKG- sinus tachycardia otherwise normal EKG.  No acute ischemic changes.  Heart rate is 101 per minute.      All labs were reviewed. Medical Records reviewed.     MDM/Course:  Visit Vitals  Vitals:    01/22/21 1100 01/22/21 1130 01/22/21 1200 01/22/21 1215   BP: (!) 134/90 (!) 126/90 132/88    Pulse: 96 91 89 93   Resp:       Temp:       SpO2: 97% 97% 99% 99%   Weight:       Height:       BMI:                  Disposition: Discharged    Clinical Impression:   Encounter Diagnoses   Name Primary?   . COVID-19 virus detected Yes   . Weakness      Follow Up: No follow-up provider specified.  Medications Prescribed:   New Prescriptions    NIRMATRELVIR-RITONAVIR (PAXLOVID, EUA,) 150 MG X 2- 100 MG ORAL TABLET    Take 2 Tablets by mouth Every 12 hours for 5 days   Plan and treatment.  Patient has generalized weakness.  The his COVID test is positive I discussed results with him and he was discharged home on backs low bit 150 mg 2 tablet b.i.d. for 5 days.  Follow COVID-19 CDC guidelines at home.      Emeline Gins, MD 01/22/2021, 12:27

## 2021-01-22 NOTE — ED Triage Notes (Signed)
EMS REPORTS: HAS STAGE 3 KIDNEY DISEASE. FOR 2 WEEKS, PT FEELS WEAK AND SHAKY. HAS NAUSEA AND BODY PAIN

## 2021-02-25 ENCOUNTER — Telehealth: Payer: Self-pay | Admitting: *Deleted

## 2021-02-25 NOTE — Telephone Encounter (Signed)
Received referral for low dose lung cancer screening CT scan. Message left at phone number listed in EMR for patient to call me back to facilitate scheduling scan.  

## 2021-11-29 ENCOUNTER — Encounter: Payer: Self-pay | Admitting: Emergency Medicine

## 2021-11-29 ENCOUNTER — Other Ambulatory Visit: Payer: Self-pay

## 2021-11-29 ENCOUNTER — Emergency Department
Admission: EM | Admit: 2021-11-29 | Discharge: 2021-11-29 | Disposition: A | Discharge status: Home / Self Care | Payer: Commercial Managed Care - HMO | Attending: Emergency Medicine | Admitting: Emergency Medicine

## 2021-11-29 ENCOUNTER — Emergency Department: Payer: Commercial Managed Care - HMO

## 2021-11-29 DIAGNOSIS — S0181XA Laceration without foreign body of other part of head, initial encounter: Secondary | ICD-10-CM | POA: Insufficient documentation

## 2021-11-29 DIAGNOSIS — Z23 Encounter for immunization: Secondary | ICD-10-CM | POA: Insufficient documentation

## 2021-11-29 DIAGNOSIS — Z7982 Long term (current) use of aspirin: Secondary | ICD-10-CM | POA: Diagnosis not present

## 2021-11-29 DIAGNOSIS — S0993XA Unspecified injury of face, initial encounter: Secondary | ICD-10-CM | POA: Diagnosis present

## 2021-11-29 DIAGNOSIS — Z720 Tobacco use: Secondary | ICD-10-CM | POA: Diagnosis not present

## 2021-11-29 DIAGNOSIS — W01198A Fall on same level from slipping, tripping and stumbling with subsequent striking against other object, initial encounter: Secondary | ICD-10-CM | POA: Insufficient documentation

## 2021-11-29 MED ORDER — BACITRACIN ZINC 500 UNIT/GM EX OINT
TOPICAL_OINTMENT | Freq: Once | CUTANEOUS | Status: AC
  Filled 2021-11-29: qty 1.8

## 2021-11-29 MED ORDER — LIDOCAINE-EPINEPHRINE (PF) 1 %-1:200000 IJ SOLN
20.0000 mL | Freq: Once | INTRAMUSCULAR | Status: DC
  Filled 2021-11-29: qty 20

## 2021-11-29 MED ORDER — CLINDAMYCIN HCL 150 MG PO CAPS
300.0000 mg | ORAL_CAPSULE | Freq: Once | ORAL | Status: AC
  Administered 2021-11-29: 300 mg via ORAL
  Filled 2021-11-29: qty 2

## 2021-11-29 MED ORDER — OXYCODONE-ACETAMINOPHEN 5-325 MG PO TABS
1.0000 | ORAL_TABLET | Freq: Once | ORAL | Status: AC
  Administered 2021-11-29: 1 via ORAL
  Filled 2021-11-29: qty 1

## 2021-11-29 MED ORDER — CHLORHEXIDINE GLUCONATE 0.12 % MT SOLN: 15.0000 mL | Freq: Two times a day (BID) | OROMUCOSAL | 0 refills | Status: AC

## 2021-11-29 MED ORDER — ONDANSETRON 4 MG PO TBDP: 4.0000 mg | ORAL_TABLET | Freq: Four times a day (QID) | ORAL | 0 refills | Status: AC | PRN

## 2021-11-29 MED ORDER — ONDANSETRON 4 MG PO TBDP
4.0000 mg | ORAL_TABLET | Freq: Once | ORAL | Status: AC
  Administered 2021-11-29: 4 mg via ORAL
  Filled 2021-11-29: qty 1

## 2021-11-29 MED ORDER — CLINDAMYCIN HCL 300 MG PO CAPS: 300.0000 mg | ORAL_CAPSULE | Freq: Three times a day (TID) | ORAL | 0 refills | Status: AC

## 2021-11-29 MED ORDER — TETANUS-DIPHTH-ACELL PERTUSSIS 5-2.5-18.5 LF-MCG/0.5 IM SUSY
0.5000 mL | PREFILLED_SYRINGE | Freq: Once | INTRAMUSCULAR | Status: AC
  Administered 2021-11-29: 0.5 mL via INTRAMUSCULAR
  Filled 2021-11-29: qty 0.5

## 2021-11-29 MED ORDER — OXYCODONE-ACETAMINOPHEN 5-325 MG PO TABS: 1.0000 | ORAL_TABLET | ORAL | 0 refills | Status: AC | PRN
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
## 2021-11-29 NOTE — ED Triage Notes (Signed)
Pt to ED via POV, pt states mechanical fall after tripping over his cat. Pt states laceration to L chin. Pt states hit his head and cut himself on the edge of a table, denies LOC.  ? ?Pt with complex facial laceration, approx 2-2.5 in laceration parallel to the lip, bleeding controlled on assessment.  ?

## 2021-11-29 NOTE — Discharge Instructions (Addendum)
I recommend close follow-up with ENT as well as your dentist. ? ?I recommend that you take an over-the-counter probiotic daily while taking antibiotics. ? ?You have 13 sutures on the outside of your face that will need to be removed in approximately 7 to 10 days.  You may apply over-the-counter Neosporin daily to your wound.  You may clean this wound gently with warm soap and water. ? ?You have multiple absorbable sutures inside of your mouth and inside of the soft tissues of your chin that do not need to be removed. ? ?I recommend a soft diet for the next 3 to 4 days.  I recommend that you rinse your mouth out with warm water each time after eating or smoking. ? ?You are being provided a prescription for opiates (also known as narcotics) for pain control.  Opiates can be addictive and should only be used when absolutely necessary for pain control when other alternatives do not work.  We recommend you only use them for the recommended amount of time and only as prescribed.  Please do not take with other sedative medications or alcohol.  Please do not drive, operate machinery, make important decisions while taking opiates.  Please note that these medications can be addictive and have high abuse potential.  Patients can become addicted to narcotics after only taking them for a few days.  Please keep these medications locked away from children, teenagers or any family members with history of substance abuse.  Narcotic pain medicine may also make you constipated.  You may use over-the-counter medications such as MiraLAX, Colace to prevent constipation.  If you become constipated, you may use over-the-counter enemas as needed.  Itching and nausea are also common side effects of narcotic pain medication.  If you develop uncontrolled vomiting or a rash, please stop these medications and seek medical care. ? ?

## 2022-10-06 ENCOUNTER — Other Ambulatory Visit: Payer: Commercial Managed Care - HMO

## 2022-12-29 ENCOUNTER — Encounter: Payer: Self-pay | Admitting: Gastroenterology

## 2022-12-29 NOTE — H&P (Signed)
Pre-Procedure H&P   Patient ID: Shaun Bradley is a 62 y.o. male.  Gastroenterology Provider: Jaynie Collins, DO  Referring Provider: Fransico Setters, NP PCP: Lauro Regulus, MD  Date: 12/30/2022  HPI Shaun Bradley is a 62 y.o. male who presents today for Colonoscopy for Surveillance-personal history of colon polyps .  Patient last underwent colonoscopy in August 2018 demonstrating 2 adenomatous polyps.  Internal hemorrhoids and sigmoid diverticulosis were also noted.  He also had an adenomatous polyp in 2012.  Bowel movements move regularly without melena hematochezia diarrhea or constipation  Family history of colon polyps in his mother.  He has a 50-pack-year history and actively smokes 1 pack/day.  Drinks 6-7 beers per day.  Hemoglobin 14.2 MCV 99 platelets 243,000 creatinine 0.9 INR 0.9   Past Medical History:  Diagnosis Date   Benign prostatic hypertrophy without urinary obstruction 09/11/2014   Current tobacco use 09/15/2015   Overview:  Have discussed dc multiple times    Encounter for general adult medical examination without abnormal findings 09/15/2015   Last Assessment & Plan:  Colonoscopy with one adenoma 2012 and due 2017. Tobacco use noted.     Plantar fasciitis 08/12/2017    Past Surgical History:  Procedure Laterality Date   COLONOSCOPY     dental procedure     1984   TOOTH EXTRACTION     2015    Family History Mother-colon polyps No h/o GI disease or malignancy  Review of Systems  Constitutional:  Negative for activity change, appetite change, chills, diaphoresis, fatigue, fever and unexpected weight change.  HENT:  Negative for trouble swallowing and voice change.   Respiratory:  Negative for shortness of breath and wheezing.   Cardiovascular:  Negative for chest pain, palpitations and leg swelling.  Gastrointestinal:  Negative for abdominal distention, abdominal pain, anal bleeding, blood in stool, constipation, diarrhea, nausea and  vomiting.  Musculoskeletal:  Negative for arthralgias and myalgias.  Skin:  Negative for color change and pallor.  Neurological:  Negative for dizziness, syncope and weakness.  Psychiatric/Behavioral:  Negative for confusion. The patient is not nervous/anxious.   All other systems reviewed and are negative.    Medications No current facility-administered medications on file prior to encounter.   Current Outpatient Medications on File Prior to Encounter  Medication Sig Dispense Refill   BIOTIN PO Take 1 Dose by mouth daily.      gabapentin (NEURONTIN) 600 MG tablet Take 1 tablet (600 mg total) by mouth 2 (two) times daily. Take 1 in the morning, take 1 in the evening 180 tablet 1   GLUCOSAMINE-CHONDROITIN PO Take 1 Dose by mouth daily.     Multiple Vitamins-Minerals (MULTIVITAMIN PO) Take 1 tablet by mouth daily. Centrum Silber     Specialty Vitamins Products (PROSTATE PO) Take 1 capsule by mouth daily.     vitamin B-12 (CYANOCOBALAMIN) 1000 MCG tablet Take by mouth.     vitamin C (ASCORBIC ACID) 500 MG tablet Take 500 mg by mouth daily.      VITAMIN E PO Take 1 capsule by mouth daily.     aspirin EC 81 MG tablet Take 81 mg by mouth daily.      CHANTIX CONTINUING MONTH PAK 1 MG tablet See admin instructions. see package  0   chlorhexidine (PERIDEX) 0.12 % solution Use as directed 15 mLs in the mouth or throat 2 (two) times daily. 120 mL 0   gabapentin (NEURONTIN) 300 MG capsule Take 1 tablet midday 90 capsule  1   ondansetron (ZOFRAN-ODT) 4 MG disintegrating tablet Take 1 tablet (4 mg total) by mouth every 6 (six) hours as needed for nausea or vomiting. 20 tablet 0   oxyCODONE-acetaminophen (PERCOCET/ROXICET) 5-325 MG tablet Take 1 tablet by mouth every 4 (four) hours as needed. 15 tablet 0    Pertinent medications related to GI and procedure were reviewed by me with the patient prior to the procedure   Current Facility-Administered Medications:    0.9 %  sodium chloride infusion, ,  Intravenous, Continuous, Jaynie Collins, DO, Last Rate: 20 mL/hr at 12/30/22 0940, New Bag at 12/30/22 0940  sodium chloride 20 mL/hr at 12/30/22 0940       Allergies  Allergen Reactions   Penicillin G Rash   Allergies were reviewed by me prior to the procedure  Objective   Body mass index is 18.88 kg/m. Vitals:   12/30/22 0929  BP: 122/80  Pulse: 68  Resp: 18  Temp: 98.2 F (36.8 C)  TempSrc: Temporal  SpO2: 100%  Weight: 57.2 kg  Height: 5' 8.5" (1.74 m)       Physical Exam Vitals and nursing note reviewed.  Constitutional:      General: He is not in acute distress.    Appearance: Normal appearance. He is not ill-appearing, toxic-appearing or diaphoretic.  HENT:     Head: Normocephalic and atraumatic.     Nose: Nose normal.     Mouth/Throat:     Mouth: Mucous membranes are moist.     Pharynx: Oropharynx is clear.  Eyes:     General: No scleral icterus.    Extraocular Movements: Extraocular movements intact.  Cardiovascular:     Rate and Rhythm: Normal rate and regular rhythm.     Heart sounds: Normal heart sounds. No murmur heard.    No friction rub. No gallop.  Pulmonary:     Effort: Pulmonary effort is normal. No respiratory distress.     Breath sounds: Normal breath sounds. No wheezing, rhonchi or rales.  Abdominal:     General: Bowel sounds are normal. There is no distension.     Palpations: Abdomen is soft.     Tenderness: There is no abdominal tenderness. There is no guarding or rebound.  Musculoskeletal:     Cervical back: Neck supple.     Right lower leg: No edema.     Left lower leg: No edema.  Skin:    General: Skin is warm and dry.     Coloration: Skin is not jaundiced or pale.  Neurological:     General: No focal deficit present.     Mental Status: He is alert and oriented to person, place, and time. Mental status is at baseline.  Psychiatric:        Mood and Affect: Mood normal.        Behavior: Behavior normal.        Thought  Content: Thought content normal.        Judgment: Judgment normal.      Assessment:  Mr. Shaun Bradley is a 62 y.o. male  who presents today for Colonoscopy for Surveillance-personal history of colon polyps .  Plan:  Colonoscopy with possible intervention today  Colonoscopy with possible biopsy, control of bleeding, polypectomy, and interventions as necessary has been discussed with the patient/patient representative. Informed consent was obtained from the patient/patient representative after explaining the indication, nature, and risks of the procedure including but not limited to death, bleeding, perforation, missed neoplasm/lesions, cardiorespiratory compromise, and  reaction to medications. Opportunity for questions was given and appropriate answers were provided. Patient/patient representative has verbalized understanding is amenable to undergoing the procedure.   Jaynie Collins, DO  [facility] Gastroenterology  Portions of the record may have been created with voice recognition software. Occasional wrong-word or 'sound-a-like' substitutions may have occurred due to the inherent limitations of voice recognition software.  Read the chart carefully and recognize, using context, where substitutions may have occurred.

## 2022-12-30 ENCOUNTER — Encounter: Payer: Self-pay | Admitting: Gastroenterology

## 2022-12-30 ENCOUNTER — Other Ambulatory Visit: Payer: Self-pay

## 2022-12-30 ENCOUNTER — Ambulatory Visit: Payer: Commercial Managed Care - HMO | Admitting: Anesthesiology

## 2022-12-30 ENCOUNTER — Ambulatory Visit
Admission: RE | Admit: 2022-12-30 | Discharge: 2022-12-30 | Disposition: A | Discharge status: Home / Self Care | Payer: Commercial Managed Care - HMO | Attending: Gastroenterology | Admitting: Gastroenterology

## 2022-12-30 ENCOUNTER — Encounter
Admission: RE | Disposition: A | Discharge status: Home / Self Care | Payer: Self-pay | Source: Home / Self Care | Attending: Gastroenterology

## 2022-12-30 DIAGNOSIS — Z1211 Encounter for screening for malignant neoplasm of colon: Secondary | ICD-10-CM | POA: Diagnosis present

## 2022-12-30 DIAGNOSIS — D120 Benign neoplasm of cecum: Secondary | ICD-10-CM | POA: Insufficient documentation

## 2022-12-30 DIAGNOSIS — Z83719 Family history of colon polyps, unspecified: Secondary | ICD-10-CM | POA: Diagnosis not present

## 2022-12-30 DIAGNOSIS — F17210 Nicotine dependence, cigarettes, uncomplicated: Secondary | ICD-10-CM | POA: Insufficient documentation

## 2022-12-30 DIAGNOSIS — K640 First degree hemorrhoids: Secondary | ICD-10-CM | POA: Insufficient documentation

## 2022-12-30 DIAGNOSIS — K5730 Diverticulosis of large intestine without perforation or abscess without bleeding: Secondary | ICD-10-CM | POA: Insufficient documentation

## 2022-12-30 DIAGNOSIS — Z09 Encounter for follow-up examination after completed treatment for conditions other than malignant neoplasm: Secondary | ICD-10-CM | POA: Insufficient documentation

## 2022-12-30 DIAGNOSIS — Z86010 Personal history of colonic polyps: Secondary | ICD-10-CM | POA: Diagnosis not present

## 2022-12-30 HISTORY — PX: COLONOSCOPY: SHX5424

## 2022-12-30 SURGERY — COLONOSCOPY
Anesthesia: General

## 2022-12-30 MED ORDER — PROPOFOL 500 MG/50ML IV EMUL
INTRAVENOUS | Status: DC | PRN
  Administered 2022-12-30: 200 ug/kg/min via INTRAVENOUS

## 2022-12-30 MED ORDER — DEXMEDETOMIDINE HCL IN NACL 200 MCG/50ML IV SOLN
INTRAVENOUS | Status: DC | PRN
  Administered 2022-12-30: 20 ug via INTRAVENOUS

## 2022-12-30 MED ORDER — FENTANYL CITRATE (PF) 100 MCG/2ML IJ SOLN
INTRAMUSCULAR | Status: AC
  Filled 2022-12-30: qty 2

## 2022-12-30 MED ORDER — FENTANYL CITRATE (PF) 100 MCG/2ML IJ SOLN
INTRAMUSCULAR | Status: DC | PRN
  Administered 2022-12-30: 100 ug via INTRAVENOUS

## 2022-12-30 MED ORDER — MIDAZOLAM HCL 2 MG/2ML IJ SOLN
INTRAMUSCULAR | Status: DC | PRN
  Administered 2022-12-30: 2 mg via INTRAVENOUS

## 2022-12-30 MED ORDER — SODIUM CHLORIDE 0.9 % IV SOLN: INTRAVENOUS | Status: DC

## 2022-12-30 MED ORDER — MIDAZOLAM HCL 2 MG/2ML IJ SOLN
INTRAMUSCULAR | Status: AC
  Filled 2022-12-30: qty 2

## 2022-12-30 MED ORDER — STERILE WATER FOR IRRIGATION IR SOLN
Status: DC | PRN
  Administered 2022-12-30: 60 mL

## 2022-12-30 NOTE — Interval H&P Note (Signed)
History and Physical Interval Note: Preprocedure H&P from 12/30/22  was reviewed and there was no interval change after seeing and examining the patient.  Written consent was obtained from the patient after discussion of risks, benefits, and alternatives. Patient has consented to proceed with Colonoscopy with possible intervention   12/30/2022 10:12 AM  Shaun Bradley  has presented today for surgery, with the diagnosis of History of colon polyps (Z86.010).  The various methods of treatment have been discussed with the patient and family. After consideration of risks, benefits and other options for treatment, the patient has consented to  Procedure(s): COLONOSCOPY (N/A) as a surgical intervention.  The patient's history has been reviewed, patient examined, no change in status, stable for surgery.  I have reviewed the patient's chart and labs.  Questions were answered to the patient's satisfaction.     Jaynie Collins

## 2022-12-30 NOTE — Op Note (Signed)
[facility] Gastroenterology Patient Name: Shaun Bradley Procedure Date: 12/30/2022 10:04 AM MRN: [id number] Account #: [id number] Date of Birth: 1960-10-02 Admit Type: Outpatient Age: 62 Room: [facility] ENDO ROOM 1 Gender: Male Note Status: Finalized Instrument Name: Peds Colonoscope [id number] Procedure:             Colonoscopy Indications:           High risk colon cancer surveillance: Personal history                         of colonic polyps Providers:             Trenda Moots, DO Referring MD:          Marya Amsler. Dareen Piano MD, MD (Referring MD) Medicines:             Monitored Anesthesia Care Complications:         No immediate complications. Estimated blood loss:                         Minimal. Procedure:             Pre-Anesthesia Assessment:                        - Prior to the procedure, a History and Physical was                         performed, and patient medications and allergies were                         reviewed. The patient is competent. The risks and                         benefits of the procedure and the sedation options and                         risks were discussed with the patient. All questions                         were answered and informed consent was obtained.                         Patient identification and proposed procedure were                         verified by the physician, the nurse, the anesthetist                         and the technician in the endoscopy suite. Mental                         Status Examination: alert and oriented. Airway                         Examination: normal oropharyngeal airway and neck                         mobility. Respiratory Examination: clear to  auscultation. CV Examination: RRR, no murmurs, no S3                         or S4. Prophylactic Antibiotics: The patient does not                         require prophylactic antibiotics. Prior                          Anticoagulants: The patient has taken no anticoagulant                         or antiplatelet agents. ASA Grade Assessment: II - A                         patient with mild systemic disease. After reviewing                         the risks and benefits, the patient was deemed in                         satisfactory condition to undergo the procedure. The                         anesthesia plan was to use monitored anesthesia care                         (MAC). Immediately prior to administration of                         medications, the patient was re-assessed for adequacy                         to receive sedatives. The heart rate, respiratory                         rate, oxygen saturations, blood pressure, adequacy of                         pulmonary ventilation, and response to care were                         monitored throughout the procedure. The physical                         status of the patient was re-assessed after the                         procedure.                        After obtaining informed consent, the colonoscope was                         passed under direct vision. Throughout the procedure,                         the patient's blood pressure, pulse, and oxygen  saturations were monitored continuously. The                         Colonoscope was introduced through the anus and                         advanced to the the terminal ileum, with                         identification of the appendiceal orifice and IC                         valve. The colonoscopy was performed without                         difficulty. The patient tolerated the procedure well.                         The quality of the bowel preparation was evaluated                         using the BBPS [facility] Bowel Preparation Scale) with                         scores of: Right Colon = 2 (minor amount of residual                         staining, small  fragments of stool and/or opaque                         liquid, but mucosa seen well), Transverse Colon = 3                         (entire mucosa seen well with no residual staining,                         small fragments of stool or opaque liquid) and Left                         Colon = 3 (entire mucosa seen well with no residual                         staining, small fragments of stool or opaque liquid).                         The total BBPS score equals 8. The quality of the                         bowel preparation was excellent. The terminal ileum,                         ileocecal valve, appendiceal orifice, and rectum were                         photographed. Findings:      The perianal and digital rectal examinations were normal. Pertinent       negatives include normal sphincter tone.  The terminal ileum appeared normal. Estimated blood loss: none.      Retroflexion in the right colon was performed.      Multiple small-mouthed diverticula were found in the recto-sigmoid colon       and sigmoid colon. Estimated blood loss: none.      Non-bleeding internal hemorrhoids were found during retroflexion. The       hemorrhoids were Grade I (internal hemorrhoids that do not prolapse).       Estimated blood loss: none.      Four sessile polyps were found in the sigmoid colon (3) and cecum (1).       The polyps were 1 to 2 mm in size. These polyps were removed with a       jumbo cold forceps. Resection and retrieval were complete. Estimated       blood loss was minimal.      Two sessile polyps were found in the cecum. The polyps were 3 to 5 mm in       size. These polyps were removed with a cold snare. Resection and       retrieval were complete. Estimated blood loss was minimal.      The exam was otherwise without abnormality on direct and retroflexion       views. Impression:            - The examined portion of the ileum was normal.                        - Diverticulosis in  the recto-sigmoid colon and in the                         sigmoid colon.                        - Non-bleeding internal hemorrhoids.                        - Four 1 to 2 mm polyps in the sigmoid colon and in                         the cecum, removed with a jumbo cold forceps. Resected                         and retrieved.                        - Two 3 to 5 mm polyps in the cecum, removed with a                         cold snare. Resected and retrieved.                        - The examination was otherwise normal on direct and                         retroflexion views. Recommendation:        - Patient has a contact number available for                         emergencies. The signs and symptoms of potential  delayed complications were discussed with the patient.                         Return to normal activities tomorrow. Written                         discharge instructions were provided to the patient.                        - Discharge patient to home.                        - Resume previous diet.                        - Continue present medications.                        - No ibuprofen, naproxen, or other non-steroidal                         anti-inflammatory drugs for 5 days after polyp removal.                        - Await pathology results.                        - Repeat colonoscopy for surveillance based on                         pathology results.                        - Return to referring physician as previously                         scheduled.                        - The findings and recommendations were discussed with                         the patient. Procedure Code(s):     --- Professional ---                        [phone], Colonoscopy, flexible; with removal of                         tumor(s), polyp(s), or other lesion(s) by snare                         technique                        45380, 59, Colonoscopy, flexible; with  biopsy, single                         or multiple Diagnosis Code(s):     --- Professional ---                        Z86.010, Personal history of colonic polyps  K64.0, First degree hemorrhoids                        D12.5, Benign neoplasm of sigmoid colon                        D12.0, Benign neoplasm of cecum                        K57.30, Diverticulosis of large intestine without                         perforation or abscess without bleeding CPT copyright 2022 American Medical Association. All rights reserved. The codes documented in this report are preliminary and upon coder review may  be revised to meet current compliance requirements. Attending Participation:      I personally performed the entire procedure. Elfredia Nevins, DO Jaynie Collins DO, DO 12/30/2022 10:56:30 AM This report has been signed electronically. Number of Addenda: 0 Note Initiated On: 12/30/2022 10:04 AM Scope Withdrawal Time: 0 hours 17 minutes 2 seconds  Total Procedure Duration: 0 hours 21 minutes 19 seconds  Estimated Blood Loss:  Estimated blood loss was minimal.      [facility]

## 2022-12-30 NOTE — Transfer of Care (Signed)
Immediate Anesthesia Transfer of Care Note  Patient: Shaun Bradley  Procedure(s) Performed: COLONOSCOPY  Patient Location: PACU  Anesthesia Type:General  Level of Consciousness: drowsy  Airway & Oxygen Therapy: Patient Spontanous Breathing and Patient connected to nasal cannula oxygen  Post-op Assessment: Report given to RN and Post -op Vital signs reviewed and stable  Post vital signs: Reviewed and stable  Last Vitals:  Vitals Value Taken Time  BP 91/49 12/30/22 1051  Temp    Pulse 55 12/30/22 1052  Resp 14 12/30/22 1052  SpO2 95 % 12/30/22 1052  Vitals shown include unvalidated device data.  Last Pain:  Vitals:   12/30/22 1051  TempSrc:   PainSc: Asleep         Complications: No notable events documented.

## 2022-12-30 NOTE — Anesthesia Postprocedure Evaluation (Signed)
Anesthesia Post Note  Patient: Shaun Bradley  Procedure(s) Performed: COLONOSCOPY  Patient location during evaluation: Endoscopy Anesthesia Type: General Level of consciousness: awake and alert Pain management: pain level controlled Vital Signs Assessment: post-procedure vital signs reviewed and stable Respiratory status: spontaneous breathing, nonlabored ventilation, respiratory function stable and patient connected to nasal cannula oxygen Cardiovascular status: blood pressure returned to baseline and stable Postop Assessment: no apparent nausea or vomiting Anesthetic complications: no  No notable events documented.   Last Vitals:  Vitals:   12/30/22 1101 12/30/22 1111  BP: (!) 91/51 94/64  Pulse: (!) 52 (!) 56  Resp: 14 13  Temp:    SpO2: 93% 95%    Last Pain:  Vitals:   12/30/22 1111  TempSrc:   PainSc: 0-No pain                 Stephanie Coup

## 2022-12-30 NOTE — Anesthesia Preprocedure Evaluation (Signed)
Anesthesia Evaluation  Patient identified by MRN, date of birth, ID band Patient awake    Reviewed: Allergy & Precautions, NPO status , Patient's Chart, lab work & pertinent test results  Airway Mallampati: II  TM Distance: >3 FB Neck ROM: full    Dental  (+) Chipped   Pulmonary neg pulmonary ROS, Current Smoker and Patient abstained from smoking.   Pulmonary exam normal        Cardiovascular negative cardio ROS Normal cardiovascular exam     Neuro/Psych negative neurological ROS  negative psych ROS   GI/Hepatic negative GI ROS, Neg liver ROS,,,  Endo/Other  negative endocrine ROS    Renal/GU negative Renal ROS  negative genitourinary   Musculoskeletal   Abdominal   Peds  Hematology negative hematology ROS (+)   Anesthesia Other Findings Past Medical History: 09/11/2014: Benign prostatic hypertrophy without urinary obstruction 09/15/2015: Current tobacco use     Comment:  Overview:  Have discussed dc multiple times  09/15/2015: Encounter for general adult medical examination without  abnormal findings     Comment:  Last Assessment & Plan:  Colonoscopy with one adenoma               2012 and due 2017. Tobacco use noted.   08/12/2017: Plantar fasciitis  Past Surgical History: No date: COLONOSCOPY No date: dental procedure     Comment:  1984 No date: TOOTH EXTRACTION     Comment:  2015  BMI    Body Mass Index: 18.88 kg/m      Reproductive/Obstetrics negative OB ROS                             Anesthesia Physical Anesthesia Plan  ASA: 2  Anesthesia Plan: General   Post-op Pain Management: Minimal or no pain anticipated   Induction: Intravenous  PONV Risk Score and Plan: 3 and Propofol infusion, TIVA and Ondansetron  Airway Management Planned: Nasal Cannula  Additional Equipment: None  Intra-op Plan:   Post-operative Plan:   Informed Consent: I have reviewed the  patients History and Physical, chart, labs and discussed the procedure including the risks, benefits and alternatives for the proposed anesthesia with the patient or authorized representative who has indicated his/her understanding and acceptance.     Dental advisory given  Plan Discussed with: CRNA and Surgeon  Anesthesia Plan Comments: (Discussed risks of anesthesia with patient, including possibility of difficulty with spontaneous ventilation under anesthesia necessitating airway intervention, PONV, and rare risks such as cardiac or respiratory or neurological events, and allergic reactions. Discussed the role of CRNA in patient's perioperative care. Patient understands.)       Anesthesia Quick Evaluation

## 2022-12-31 ENCOUNTER — Encounter: Payer: Self-pay | Admitting: Gastroenterology

## 2022-12-31 LAB — SURGICAL PATHOLOGY

## 2023-01-05 ENCOUNTER — Encounter: Payer: Self-pay | Admitting: Gastroenterology

## 2023-04-09 IMAGING — CT CT MAXILLOFACIAL W/O CM
3 series · 15 of 47 positions shown, 18 images · non-contrast
Comparison: None.

CLINICAL DATA: Head trauma, moderate to severe. Neck trauma,
dangerous injury mechanism. Facial trauma, blunt



[Series 2: max soft (person_name) · axial · 0.35mm/px · z∈[-226,-28]mm · 9 of 115 slices shown, 12 images]
[im 8/115  brain]
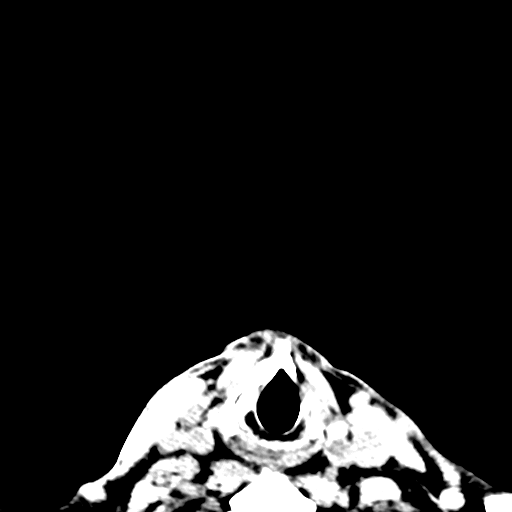
[im 8/115  bone]
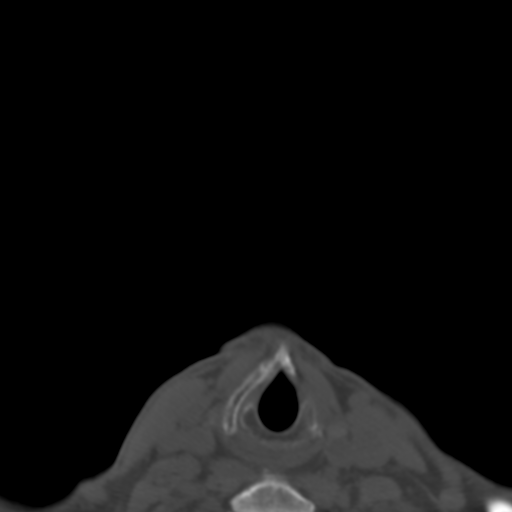
[im 20/115  bone]
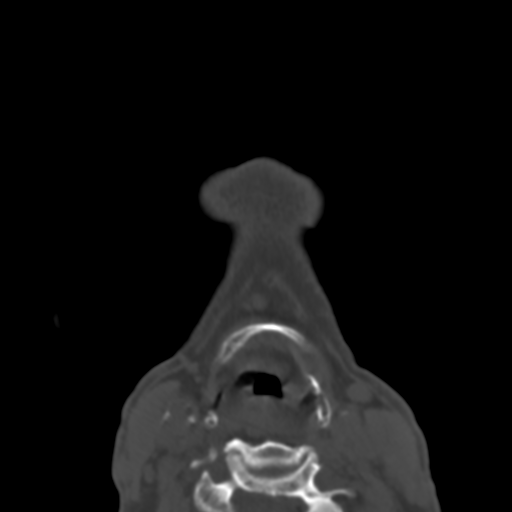
[im 32/115  bone]
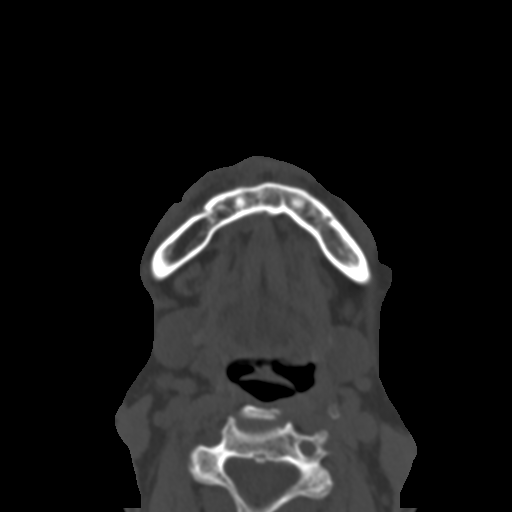
[im 44/115  bone]
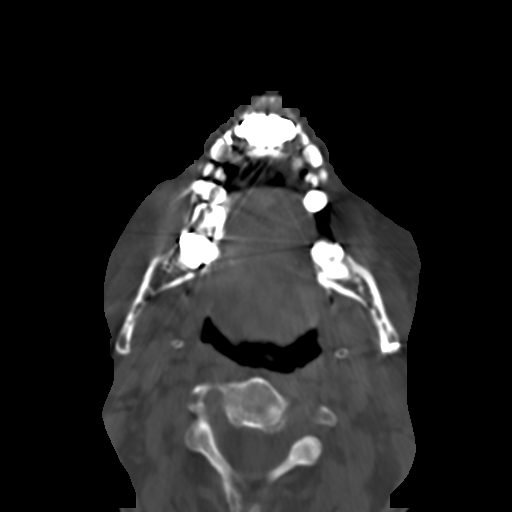
[im 59/115  brain]
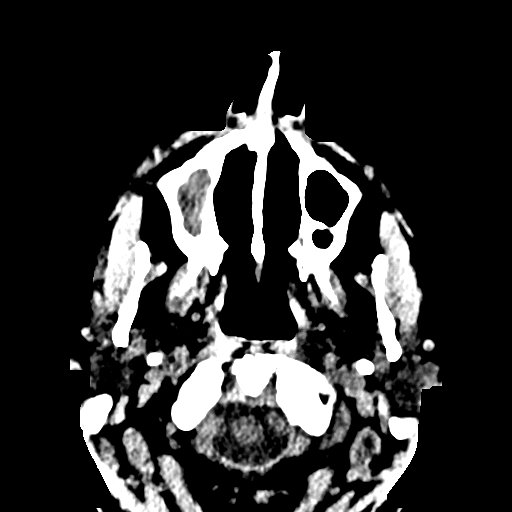
[im 59/115  bone]
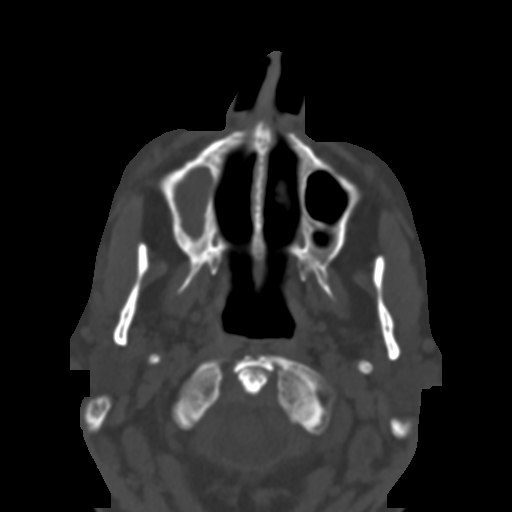
[im 71/115  bone]
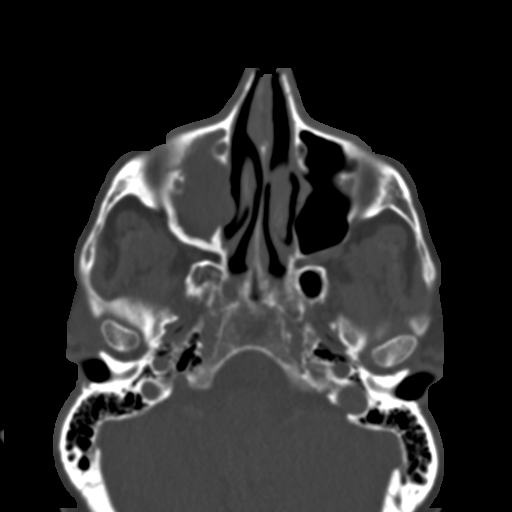
[im 83/115  bone]
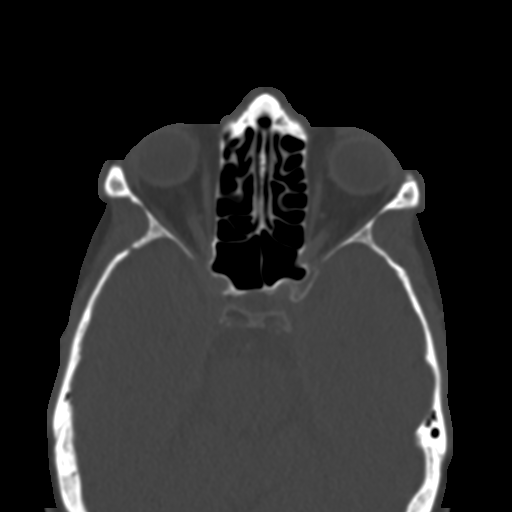
[im 95/115  bone]
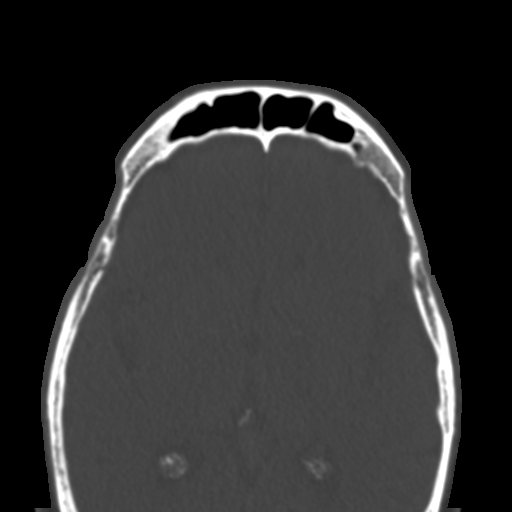
[im 107/115  brain]
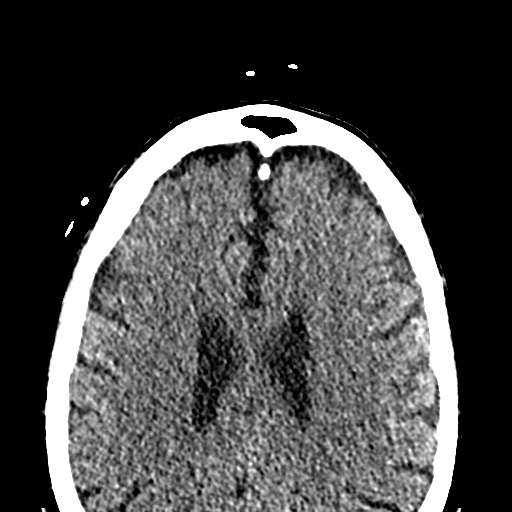
[im 107/115  bone]
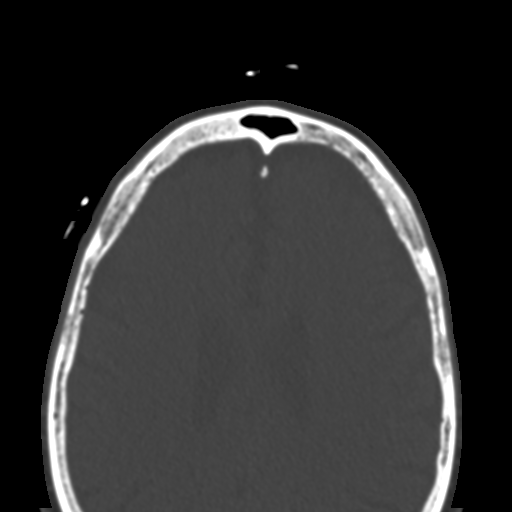

[Series 6: coronal soft · coronal · 0.38mm/px · 3 of 94 slices shown]
[im 32/94  bone]
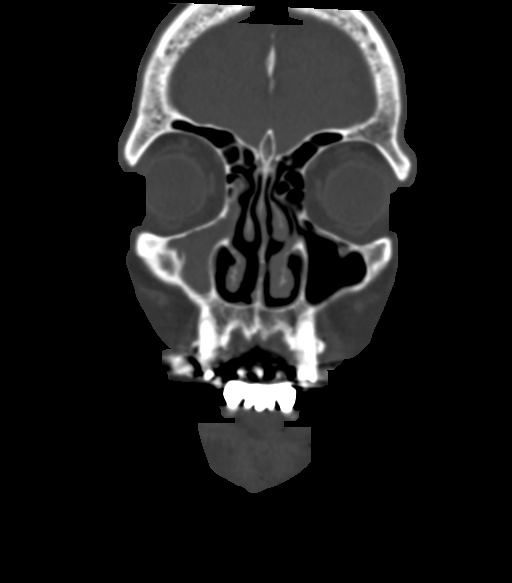
[im 42/94  bone]
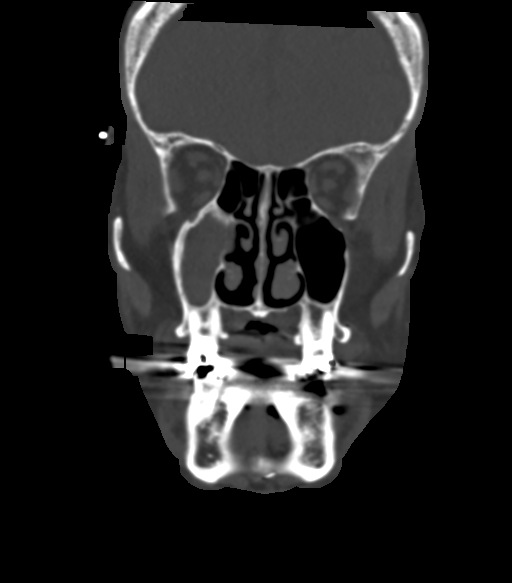
[im 52/94  bone]
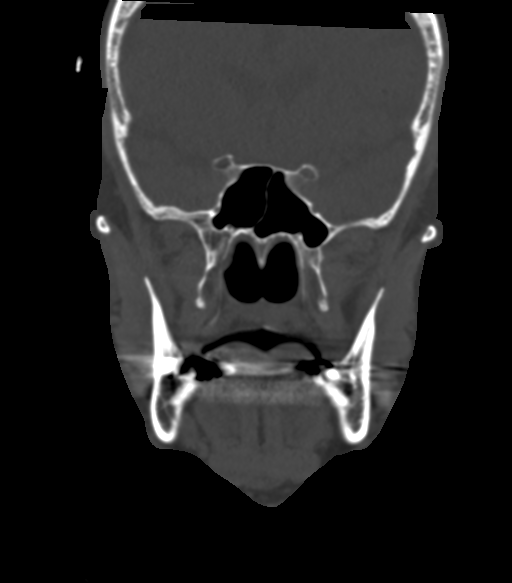

[Series 7: sagittal soft (person_name) · sagittal · 0.37mm/px · 3 of 98 slices shown]
[im 33/98  bone]
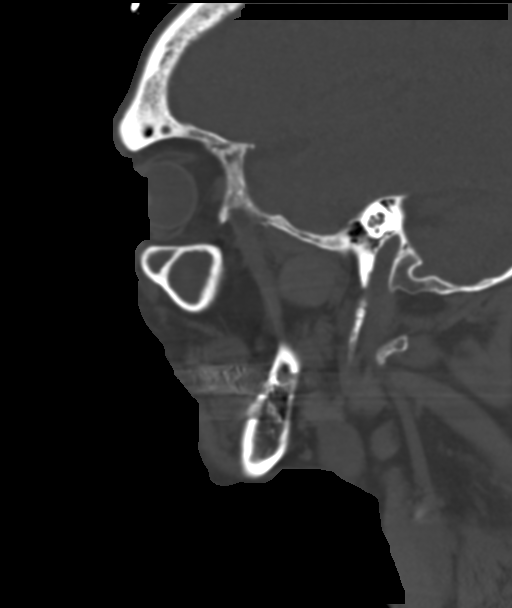
[im 49/98  bone]
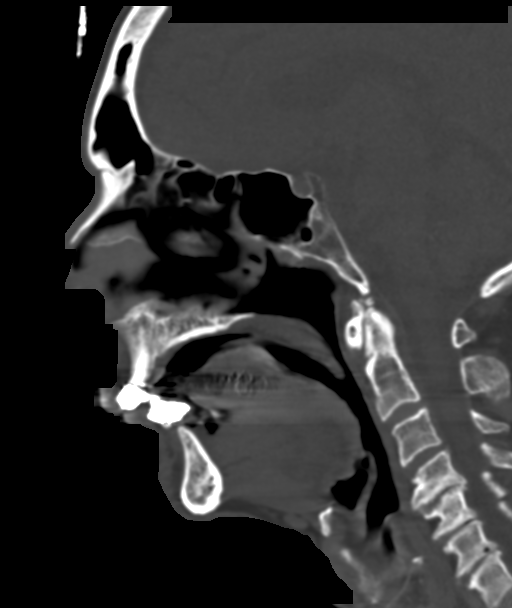
[im 65/98  bone]
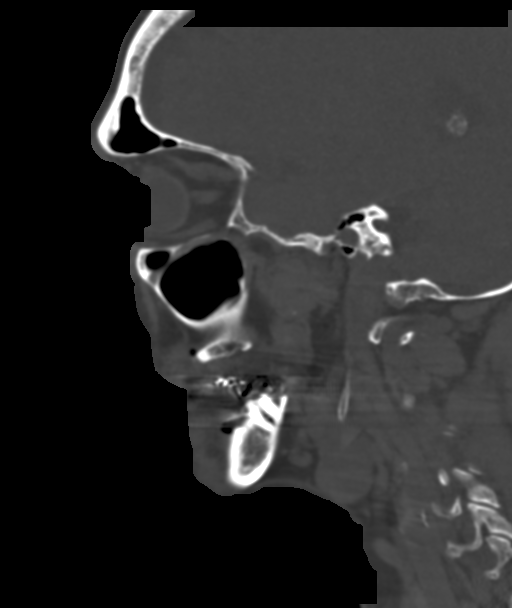

[15 of 47 positions shown; findings below may reference images not displayed]

FINDINGS: CT HEAD FINDINGS

Brain: No evidence of acute infarction, hemorrhage, hydrocephalus,
extra-axial collection or mass lesion/mass effect. Mild cerebral
atrophy.

Vascular: No hyperdense vessel or unexpected calcification.

Skull: Calvarium appears intact.

Other: None.

CT MAXILLOFACIAL FINDINGS

Osseous: No fracture or mandibular dislocation. No destructive
process.

Orbits: Negative. No traumatic or inflammatory finding.

Sinuses: Opacification of the right maxillary antrum likely
representing retention cysts. Paranasal sinuses and mastoid air
cells are otherwise clear.

Soft tissues: Negative.

CT CERVICAL SPINE FINDINGS

Alignment: Normal.

Skull base and vertebrae: No acute fracture. No primary bone lesion
or focal pathologic process.

Soft tissues and spinal canal: No prevertebral fluid or swelling. No
visible canal hematoma.

Disc levels: Degenerative changes with disc space narrowing and
endplate osteophyte formation most prominent at C4-5, C5-6, and C6-7
levels. Degenerative changes in the facet joints.

Upper chest: Mild scarring and emphysematous changes in the lung
apices. Vascular calcifications.

Other: None.
IMPRESSION: 1. No acute intracranial abnormalities.  Mild cerebral atrophy.
2. No acute depressed orbital or facial fractures identified.
3. Opacification of right maxillary antrum likely related to chronic
inflammatory changes.
4. Normal alignment of the cervical spine. Degenerative changes. No
acute displaced fractures identified.

## 2023-04-09 IMAGING — CT CT CERVICAL SPINE W/O CM
3 of 4 series · 10 of 33 positions shown, 12 images · non-contrast
Comparison: None.

CLINICAL DATA: Head trauma, moderate to severe. Neck trauma,
dangerous injury mechanism. Facial trauma, blunt



[Series 5: sagittal bone · sagittal · 0.28mm/px · 5 of 63 slices shown, 6 images]
[im 21/63  bone]
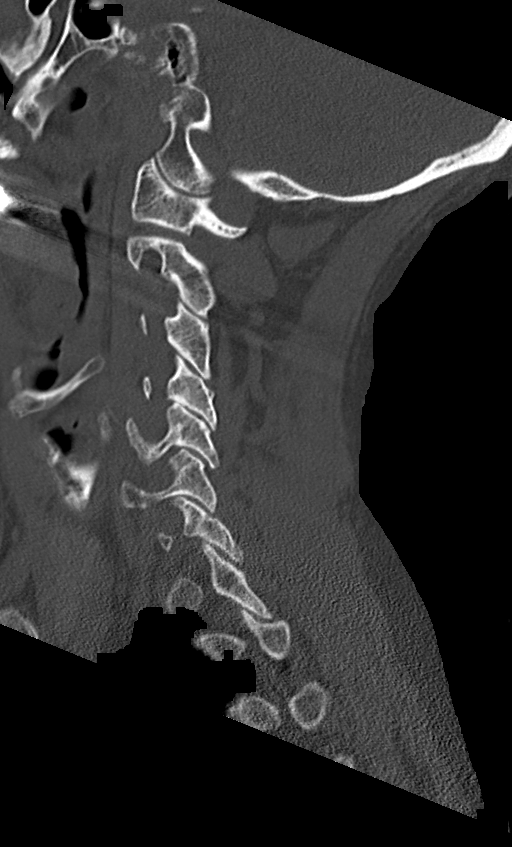
[im 26/63  bone]
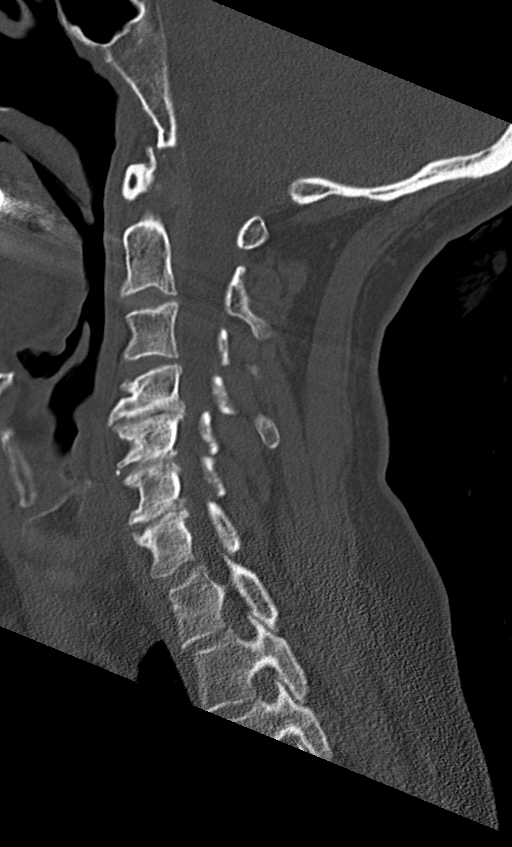
[im 32/63  soft-tissue]
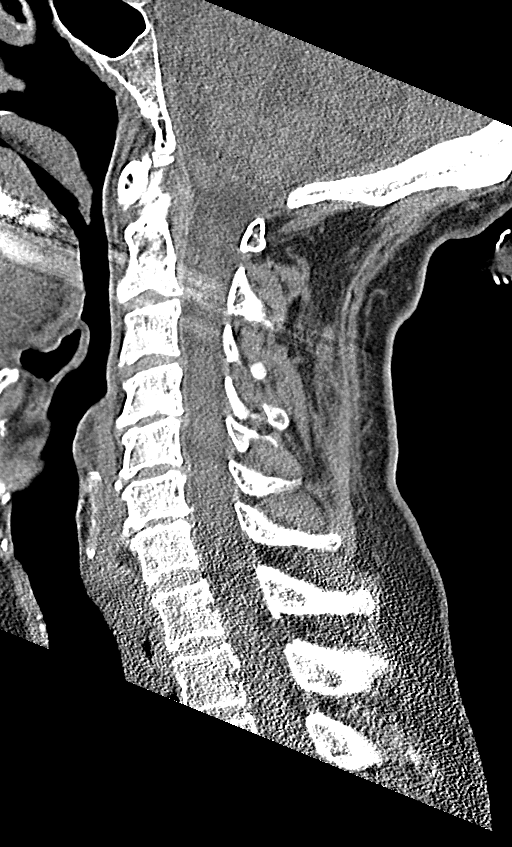
[im 32/63  bone]
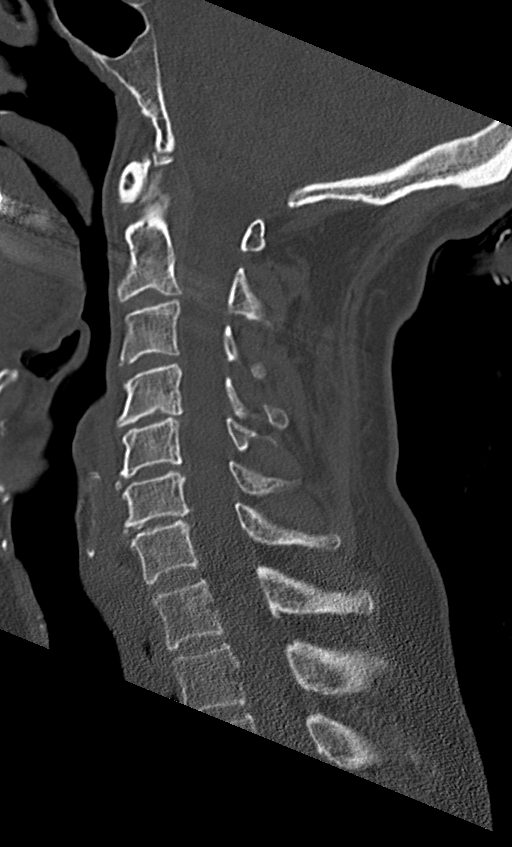
[im 37/63  bone]
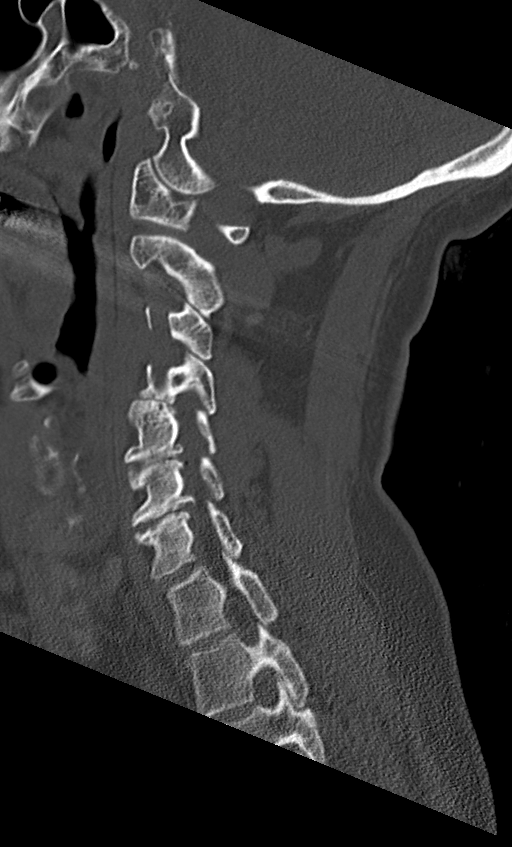
[im 42/63  bone]
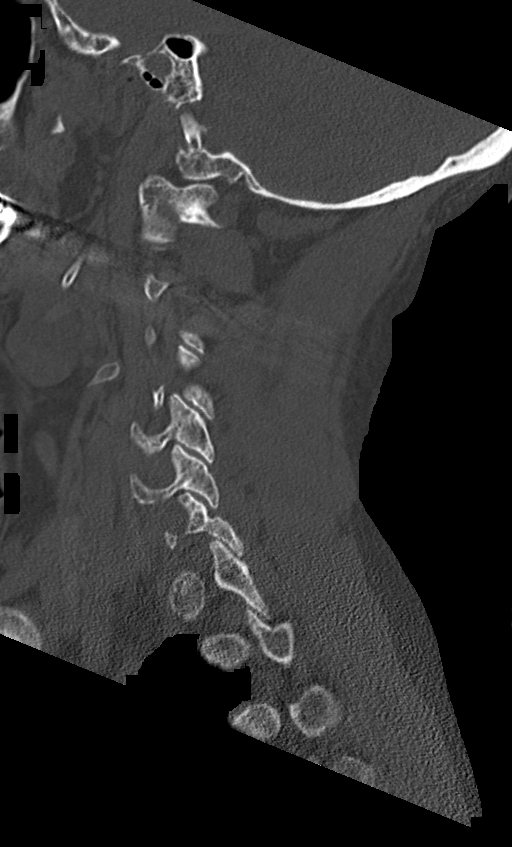

[Series 6: coronal bone · coronal · 0.24mm/px · 3 of 73 slices shown]
[im 17/73  bone]
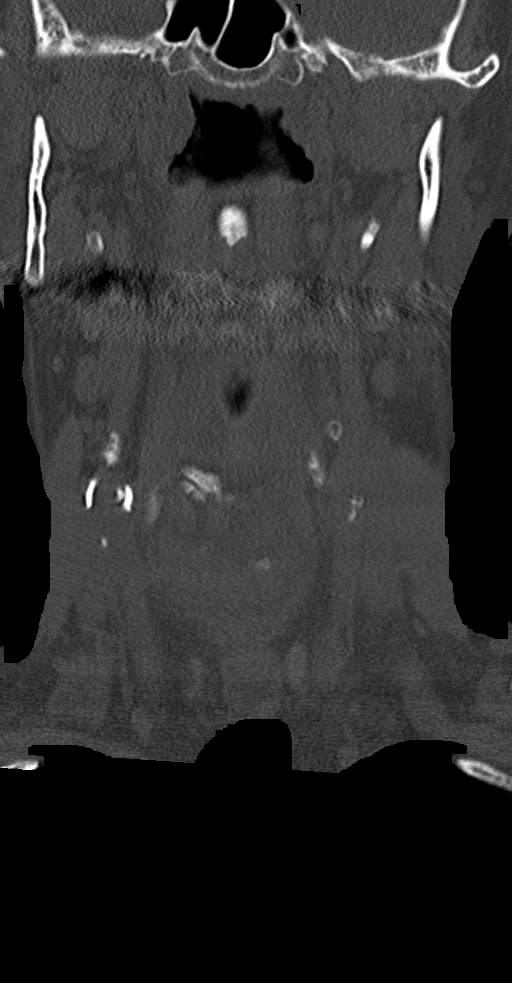
[im 30/73  bone]
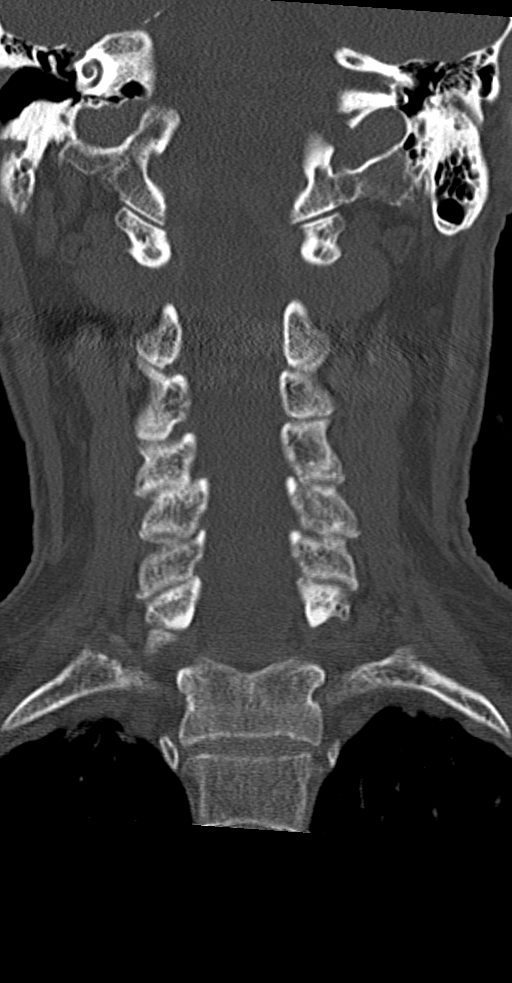
[im 43/73  bone]
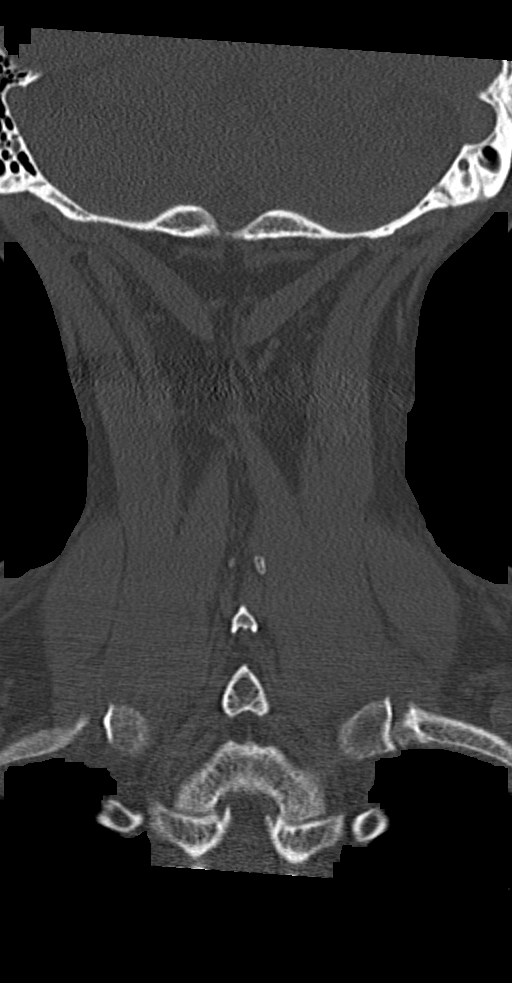

[Series 7: orthogonal axials · axial · 0.24mm/px · z∈[-259,-165]mm · 2 of 121 slices shown, 3 images]
[im 35/121  soft-tissue]
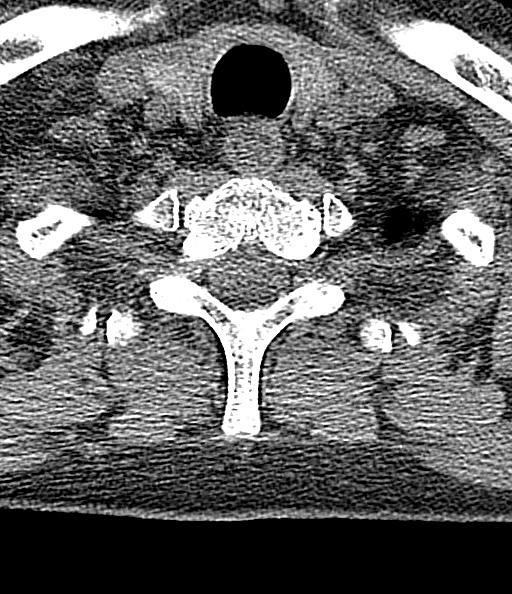
[im 35/121  bone]
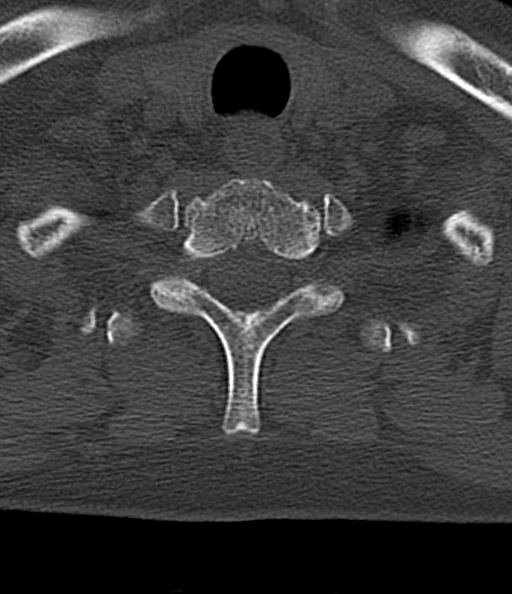
[im 86/121  bone]
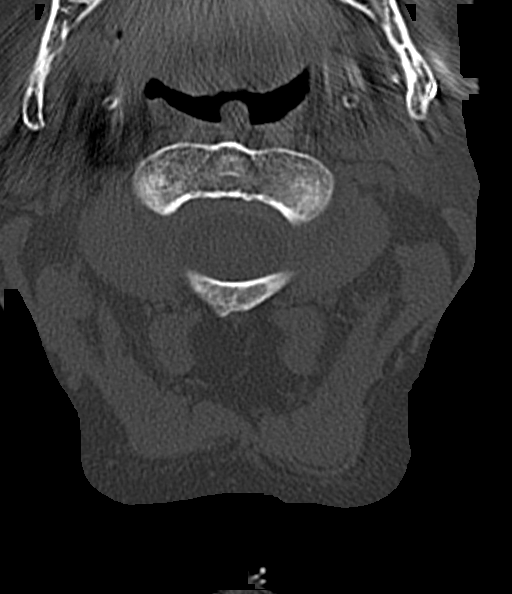

[10 of 33 positions shown; findings below may reference images not displayed]

FINDINGS: CT HEAD FINDINGS

Brain: No evidence of acute infarction, hemorrhage, hydrocephalus,
extra-axial collection or mass lesion/mass effect. Mild cerebral
atrophy.

Vascular: No hyperdense vessel or unexpected calcification.

Skull: Calvarium appears intact.

Other: None.

CT MAXILLOFACIAL FINDINGS

Osseous: No fracture or mandibular dislocation. No destructive
process.

Orbits: Negative. No traumatic or inflammatory finding.

Sinuses: Opacification of the right maxillary antrum likely
representing retention cysts. Paranasal sinuses and mastoid air
cells are otherwise clear.

Soft tissues: Negative.

CT CERVICAL SPINE FINDINGS

Alignment: Normal.

Skull base and vertebrae: No acute fracture. No primary bone lesion
or focal pathologic process.

Soft tissues and spinal canal: No prevertebral fluid or swelling. No
visible canal hematoma.

Disc levels: Degenerative changes with disc space narrowing and
endplate osteophyte formation most prominent at C4-5, C5-6, and C6-7
levels. Degenerative changes in the facet joints.

Upper chest: Mild scarring and emphysematous changes in the lung
apices. Vascular calcifications.

Other: None.
IMPRESSION: 1. No acute intracranial abnormalities.  Mild cerebral atrophy.
2. No acute depressed orbital or facial fractures identified.
3. Opacification of right maxillary antrum likely related to chronic
inflammatory changes.
4. Normal alignment of the cervical spine. Degenerative changes. No
acute displaced fractures identified.

## 2023-12-13 ENCOUNTER — Other Ambulatory Visit: Payer: Self-pay

## 2024-05-24 ENCOUNTER — Other Ambulatory Visit (HOSPITAL_COMMUNITY): Payer: Self-pay

## 2024-05-24 DIAGNOSIS — K4090 Unilateral inguinal hernia, without obstruction or gangrene, not specified as recurrent: Secondary | ICD-10-CM

## 2024-07-07 ENCOUNTER — Other Ambulatory Visit (HOSPITAL_COMMUNITY): Payer: Self-pay

## 2024-10-03 ENCOUNTER — Ambulatory Visit: Payer: Self-pay

## 2024-10-03 ENCOUNTER — Encounter (INDEPENDENT_AMBULATORY_CARE_PROVIDER_SITE_OTHER): Payer: Self-pay

## 2024-10-03 ENCOUNTER — Other Ambulatory Visit: Payer: Self-pay

## 2024-10-03 DIAGNOSIS — K4090 Unilateral inguinal hernia, without obstruction or gangrene, not specified as recurrent: Secondary | ICD-10-CM

## 2024-10-03 NOTE — Progress Notes (Signed)
 See note

## 2024-10-03 NOTE — Patient Instructions (Signed)
 General Surgery, Physician [facility]  [place] [address]  [phone]      General Surgery PRE-OP Instruction Sheet    Your surgery will be scheduled by the surgical scheduler. Once a surgery date is confirmed, the scheduler will notify you of the date. Surgery is scheduled for__________________________    Prior to surgery, you will receive a phone call from [facility]; you may still need to come in for a visit. During this visit, you will speak with the anesthesia staff. You will be directed by anesthesia as to when to stop eating and drinking prior to surgery. NO tobacco after midnight. If you have any questions regarding pre-op, please call [phone].    The [facility] will contact you between 2:00-6:00 the evening before your surgery with your arrival time and any additional instructions regarding your surgery. The staff will leave a message on your answering machine if you are not available.      Contact the person who prescribed any of the following anticoagulant or antiplatelet medicines (commonly called "blood thinners") to find out when to stop taking before surgery because they may increase your risk of bleeding. The prescriber will work with the surgery office to decide what is best for you.    Anticoagulant Medicine Antiplatelet Medicine   Warfarin (Coumadin, Jantoven)  Eliquis (Apixaban)  Pradaxa (Dabigatran)  Xarelto (Rivaroxaban)  Savaysa (Edoxaban) Aspirin  Brilinta (Tricagrelor)  Effient (Prasugrel)  Plavix (Clopidogrel)  Ticlid (Ticlopidine)     Stop any of the following anti-inflammatory (NSAID) medicines 5 days prior to surgery:  ibuprofen, advil, motrin, naproxen, aleve, celebrex, mobic and other prescription NSAID'S.    Please call the [facility] at [phone] if you need to cancel the morning of your surgery. If you are cancelling in advance, please call the appropriate number below.  On the day of your  surgery, report to the 1st floor lobby of [facility] to register. Family members may wait with you until you go into the operating room. They will then be directed to the family waiting rooms until after surgery. Once your family arrives in the waiting area, have them check in with the volunteer. After your surgery, the doctor will come to the waiting area to discuss your condition with your family.     You MUST have a responsible adult to drive you home and stay with you for 24 hours in case there are any post-operative complications.    If you have any questions between now and your surgery date, feel free to call:  Dr. Loleta Chance or Dr. Pauline Aus                                                                             [phone]  Dr. Noel Gerold, Dr. Burna Sis or Dr. Sheryle Spray                                                  [phone]  Dr. Luiz Blare  [phone]  Dr. Gerarda Gunther                                                                                       [phone]  Dr. Effie Shy, Dr. Francesco Sor, Dr. Katherene Ponto, Dr. Rubye Oaks or Dr. Andrey Campanile        [phone]          I

## 2024-10-03 NOTE — H&P (Signed)
 Minimally Invasive Surgery New Evaluation    Date: October 03, 2024     Patient Name: Raymond Day   Medical Record #: [id number]   DOB: November 30, 1960  Age: 64 y.o.  Sex: male    Referring MD: Isaiah Rush, DO  [address]  [address]  [place],  [place] [id number]    PCP: Delon Africa, MD    Reason for Visit:   Chief Complaint   Patient presents with    New Patient       History of Present Illness:     Raymond Day is a 64 y.o. male, Body mass index is 26.01 kg/m. , who presents for right inguinal hernia    History of Present Illness  Raymond Day is a 64 year old male with colitis and nicotine dependence who presents for evaluation of symptomatic right inguinal and umbilical hernias.    He reports a right inguinal hernia present for over five months, with onset in August of the previous year. The hernia is associated with significant pain localized to the hernia site, which worsens with standing and improves when supine. He notes progressive enlargement of the hernia and reports that it limits his ability to walk up and down stairs or a block. He denies chest pain or dyspnea with exertion. He describes a sensation of general malaise, stating the hernia makes his whole body feel sick. The hernia reduces in size when supine and enlarges with prolonged standing. He has also noticed intermittent enlargement of the testicle, which he attributes to the hernia.      Denies any prior abdominal surgeries, colonoscopies, or interventions for his hernias. He has undergone nasal and foot surgeries in the past, but no abdominal procedures.    He currently smokes cigarettes and uses smokeless tobacco, with a long-standing history of nicotine dependence. He expresses concern about wound healing and infection risk, particularly as he is currently taking antibiotics for a cut on his finger that has not healed. He previously quit smoking for two months in August and for three years at another time, but has since resumed.      No  previous abdominal surgeries.      Past Medical History  Past Medical History:   Diagnosis Date    Colitis     Diabetes mellitus type II     Diabetes mellitus, type 2     HTN (hypertension)     Hyperlipidemia     Kidney disease            Past Surgical History  Past Surgical History:   Procedure Laterality Date    FOOT SURGERY             Allergies  Allergies[1]    Medications  Current Outpatient Medications   Medication Sig Dispense Refill    alprazolam (XANAX) 0.5 mg Tab Take by mouth Every night as needed 1 MG (Patient not taking: Reported on 10/03/2024)      busPIRone (BUSPAR) 10 mg Oral Tablet Take 10 mg by mouth Three times a day PRN      cephalexin (KEFLEX) 500 mg Oral Capsule Take 1 Capsule (500 mg total) by mouth      cyclobenzaprine (FLEXERIL) 10 mg Oral Tablet Take 10 mg by mouth Every 8 hours as needed for Muscle spasms      lisinopriL (PRINIVIL) 10 mg Oral Tablet Take 10 mg by mouth Once a day      metoprolol tartrate (LOPRESSOR) 50 mg Oral Tablet Take 50  mg by mouth Once a day       No current facility-administered medications for this visit.       Social History  Social History     Socioeconomic History    Marital status: Divorced   Tobacco Use    Smoking status: Every Day     Current packs/day: 0.25     Types: Cigarettes    Smokeless tobacco: Current     Types: Snuff   Vaping Use    Vaping status: Never Used   Substance and Sexual Activity    Alcohol use: Not Currently    Drug use: Never   Other Topics Concern    Left hand dominant Yes     Social Determinants of Health     Transportation Needs: Low Risk (08/21/2022)    [facility]    Transportation     Has a lack of transportation kept you from medical appointments, meetings, work, or from getting things needed for daily living?: Not on file       Family History  Family Medical History:    None           Review of Systems    As described in HPI.    Physical Examination    Physical Exam  ABDOMEN: Inguinal hernia present,  enlarges with standing, reduces when supine.      BP 112/62   Pulse 63   Temp 36.7 C (98.1 F)   Ht 1.664 m (5' 5.5)   Wt 72 kg (158 lb 11.7 oz)   SpO2 98%   BMI 26.01 kg/m      Body mass index is 26.01 kg/m.  GENERAL: alert, oriented x 3, no acute distress   HEENT: Normocephalic, atraumatic  CHEST: unlabored  ABDOMEN: soft, non tender, no palpable masses,   GROIN: reducible right inguinal hernia  EXTREMITIES: no cyanosis  NEURO: normal speech    Diagnostic Testing/Labs:  Lab Results   Component Value Date    WBC 9.5 01/22/2021    RBC 4.51 01/22/2021    HGB 14.8 01/22/2021    HCT 44.0 01/22/2021    MCV 97.6 01/22/2021    MCHC 33.6 01/22/2021     Lab Results   Component Value Date    BUN 12 01/22/2021    AST 19 01/22/2021    ALT 23 01/22/2021     Lab Results   Component Value Date    BUN 12 01/22/2021     No components found for: A1C  No results found for: CHOL, HDL, TRIG  No results found for: TSH        Results          Assessment/Plan:    Raymond Day is a 64 y.o. male, Body mass index is 26.01 kg/m. , reducible right inguinal hernia    Assessment & Plan  Right inguinal hernia  Chronic, enlarging, reducible hernia with significant pain and functional limitation. Smoking increases postoperative complication risk. No cardiopulmonary symptom. Surgical risks include bleeding, infection, testicular artery/vein injury, vas deferens injury, and mesh infection. METS>4.    - Discussed surgical repair with permanent mesh placement.  - Advised cessation of smoking and avoidance of nicotine products for at least two weeks pre- and postoperatively.  - Arranged scheduling of outpatient hernia repair surgery.        Discussed possibility of incarceration, strangulation, enlargement in size over time, and the risk of emergency surgery in the face of strangulation.  Also discussed  the risk of surgery including bleeding, infection, DVT/PE, acute/chronic numbness/tingling, acute/chronic pain, bruising,  swelling, scar, asymmetry, contour deformity, poor cosmetic outcome, damage to surrounding structures, fluid collection, loss of testicle, urinary retention, recurrence, use of prosthetic materials (mesh) and the increased risk of infection, post-op infection and the possible need for re-operation and removal of mesh (if used). Also discussed the risks of general anesthetic including MI, CVA, sudden death or even reaction to anesthetic medications. The patient understands the risks, any and all questions were answered to the patient's satisfaction.    After discussion & time to ask questions pt has elected to proceed with surgical management.    [id number]    Surgical Consent has been signed (yes)  Surgical scheduling process is initiated   ER Precautions discussed     Orders:   No orders of the defined types were placed in this encounter.       Raymond Day is being evaluated as a new patient in clinic today.    Acute medical problems addressed today: hernia  Chronic medical problems addressed today: smoking      I have personally determined the risk of complications for the ongoing management of this patient.      This note was created with assistance from [employer] via capture of conversational audio. Consent was obtained from the patient and all parties present prior to recording.      Note Author:     Florene Bellini, MD   Associate Professor  Department of General Surgery  [facility]           [1] No Known Allergies
